# Patient Record
Sex: Male | Born: 1955 | Hispanic: No | Marital: Married | Smoking: Never smoker
Health system: Southern US, Community
[De-identification: ages and names within clinical notes are randomized; demographics above are authoritative.]

## PROBLEM LIST (undated history)

## (undated) DIAGNOSIS — E538 Deficiency of other specified B group vitamins: Secondary | ICD-10-CM

## (undated) DIAGNOSIS — K219 Gastro-esophageal reflux disease without esophagitis: Secondary | ICD-10-CM

## (undated) DIAGNOSIS — E559 Vitamin D deficiency, unspecified: Secondary | ICD-10-CM

## (undated) DIAGNOSIS — I7300 Raynaud's syndrome without gangrene: Secondary | ICD-10-CM

## (undated) DIAGNOSIS — I2510 Atherosclerotic heart disease of native coronary artery without angina pectoris: Secondary | ICD-10-CM

## (undated) DIAGNOSIS — M5090 Cervical disc disorder, unspecified, unspecified cervical region: Secondary | ICD-10-CM

## (undated) DIAGNOSIS — F419 Anxiety disorder, unspecified: Secondary | ICD-10-CM

## (undated) HISTORY — DX: Anxiety disorder, unspecified: F41.9

## (undated) HISTORY — DX: Raynaud's syndrome without gangrene: I73.00

## (undated) HISTORY — DX: Cervical disc disorder, unspecified, unspecified cervical region: M50.90

## (undated) HISTORY — DX: Deficiency of other specified B group vitamins: E53.8

## (undated) HISTORY — DX: Vitamin D deficiency, unspecified: E55.9

## (undated) HISTORY — PX: CARDIAC CATHETERIZATION: SHX172

## (undated) HISTORY — PX: OTHER SURGICAL HISTORY: SHX169

## (undated) HISTORY — DX: Gastro-esophageal reflux disease without esophagitis: K21.9

---

## 2004-09-28 ENCOUNTER — Ambulatory Visit: Payer: Self-pay | Admitting: Internal Medicine

## 2005-09-24 HISTORY — PX: NECK SURGERY: SHX720

## 2007-06-24 DIAGNOSIS — M545 Low back pain, unspecified: Secondary | ICD-10-CM | POA: Insufficient documentation

## 2008-03-03 ENCOUNTER — Ambulatory Visit: Payer: Self-pay | Admitting: Internal Medicine

## 2008-03-04 ENCOUNTER — Telehealth: Payer: Self-pay | Admitting: Internal Medicine

## 2008-03-04 LAB — CONVERTED CEMR LAB
Albumin: 3.8 g/dL (ref 3.5–5.2)
BUN: 12 mg/dL (ref 6–23)
Basophils Relative: 0.1 % (ref 0.0–1.0)
Creatinine, Ser: 1.3 mg/dL (ref 0.4–1.5)
Eosinophils Absolute: 0 10*3/uL (ref 0.0–0.7)
Eosinophils Relative: 0.5 % (ref 0.0–5.0)
GFR calc Af Amer: 74 mL/min
GFR calc non Af Amer: 61 mL/min
HCT: 34.6 % — ABNORMAL LOW (ref 39.0–52.0)
MCV: 85.2 fL (ref 78.0–100.0)
Monocytes Absolute: 0.3 10*3/uL (ref 0.1–1.0)
Platelets: 129 10*3/uL — ABNORMAL LOW (ref 150–400)
RBC: 4.06 M/uL — ABNORMAL LOW (ref 4.22–5.81)
WBC: 3.2 10*3/uL — ABNORMAL LOW (ref 4.5–10.5)

## 2008-03-07 ENCOUNTER — Emergency Department (HOSPITAL_COMMUNITY): Admission: EM | Admit: 2008-03-07 | Discharge: 2008-03-07 | Payer: Self-pay | Admitting: Emergency Medicine

## 2009-03-20 ENCOUNTER — Inpatient Hospital Stay (HOSPITAL_COMMUNITY): Admission: EM | Admit: 2009-03-20 | Discharge: 2009-03-22 | Payer: Self-pay | Admitting: Emergency Medicine

## 2009-04-11 ENCOUNTER — Inpatient Hospital Stay (HOSPITAL_COMMUNITY): Admission: RE | Admit: 2009-04-11 | Discharge: 2009-04-12 | Payer: Self-pay | Admitting: Neurosurgery

## 2009-07-02 ENCOUNTER — Encounter: Payer: Self-pay | Admitting: Family Medicine

## 2009-07-02 ENCOUNTER — Ambulatory Visit: Payer: Self-pay | Admitting: Family Medicine

## 2009-07-02 LAB — CONVERTED CEMR LAB
Blood in Urine, dipstick: NEGATIVE
Glucose, Urine, Semiquant: NEGATIVE
Ketones, urine, test strip: NEGATIVE

## 2009-09-24 HISTORY — PX: COLONOSCOPY: SHX174

## 2010-09-11 IMAGING — CR DG CHEST 2V
2 series · 2 of 2 positions shown · non-contrast
Comparison: 03/02/2009

CLINICAL DATA: Preop for cervical fusion.

CHEST - 2 VIEW

[view not recorded (1 of 2)]
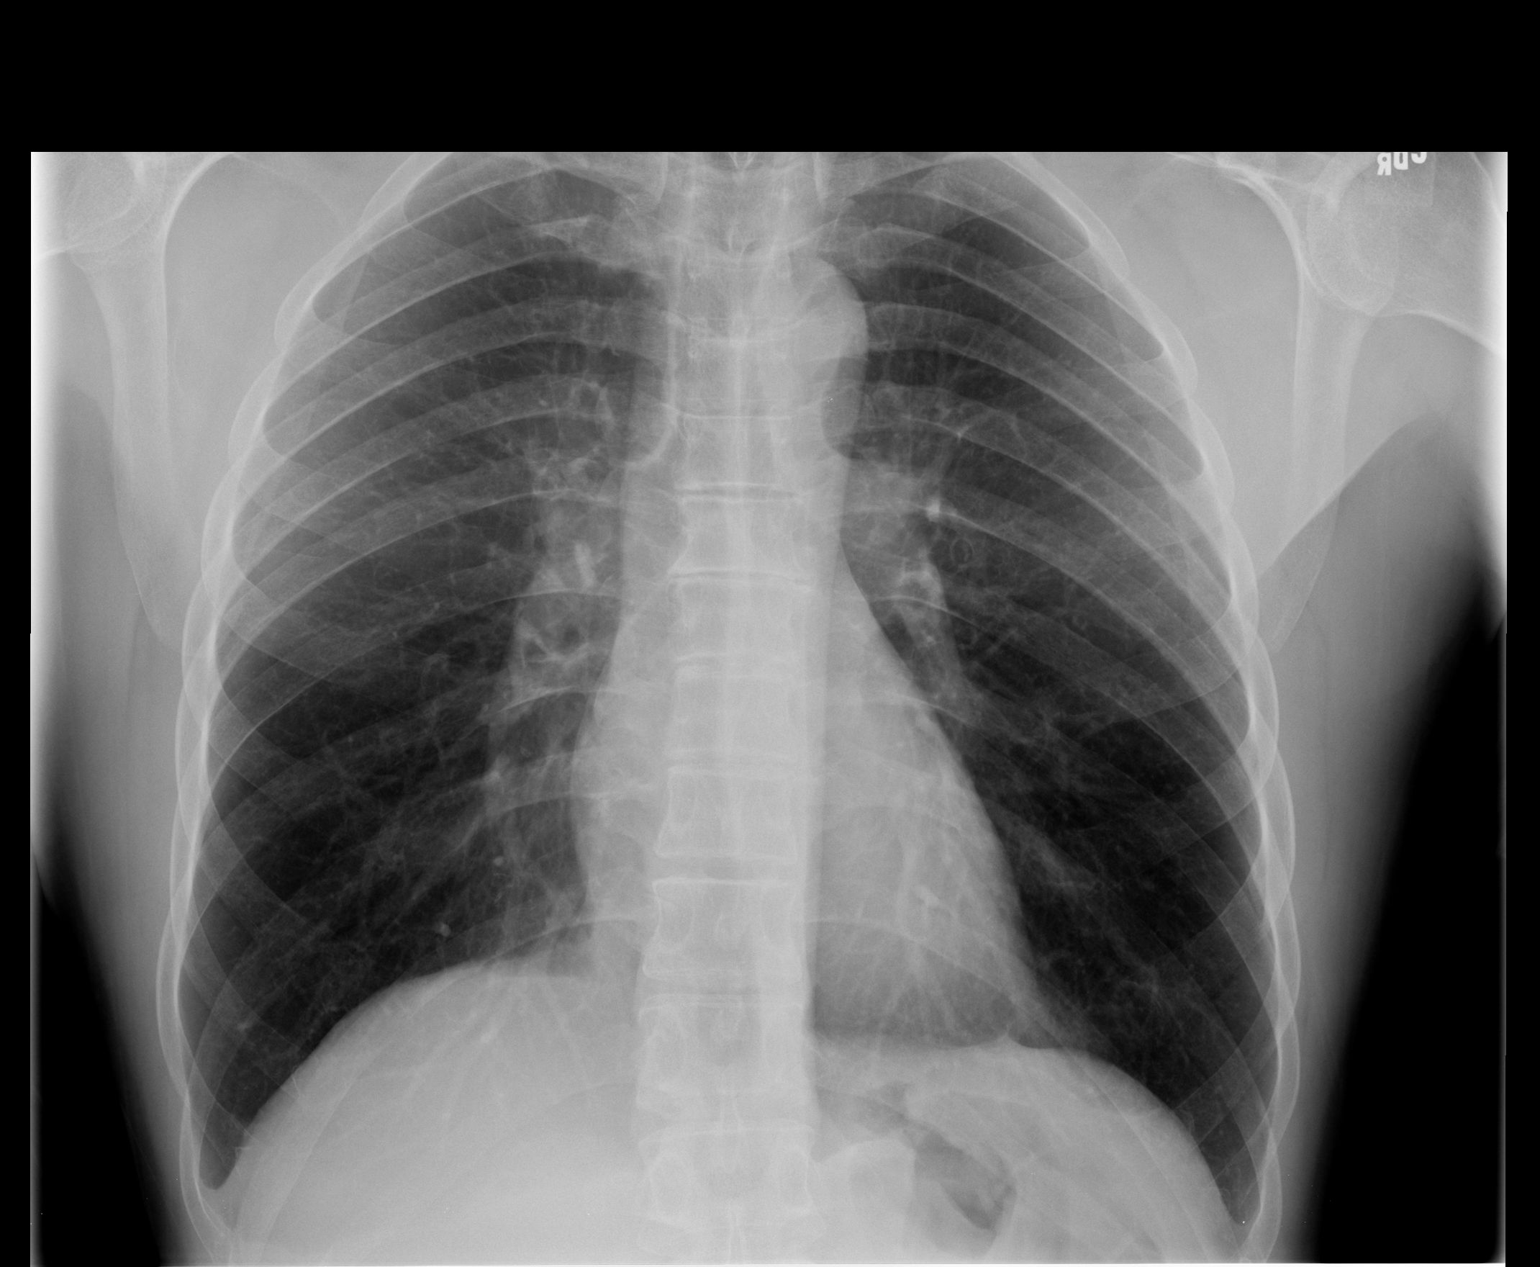

[view not recorded (2 of 2)]
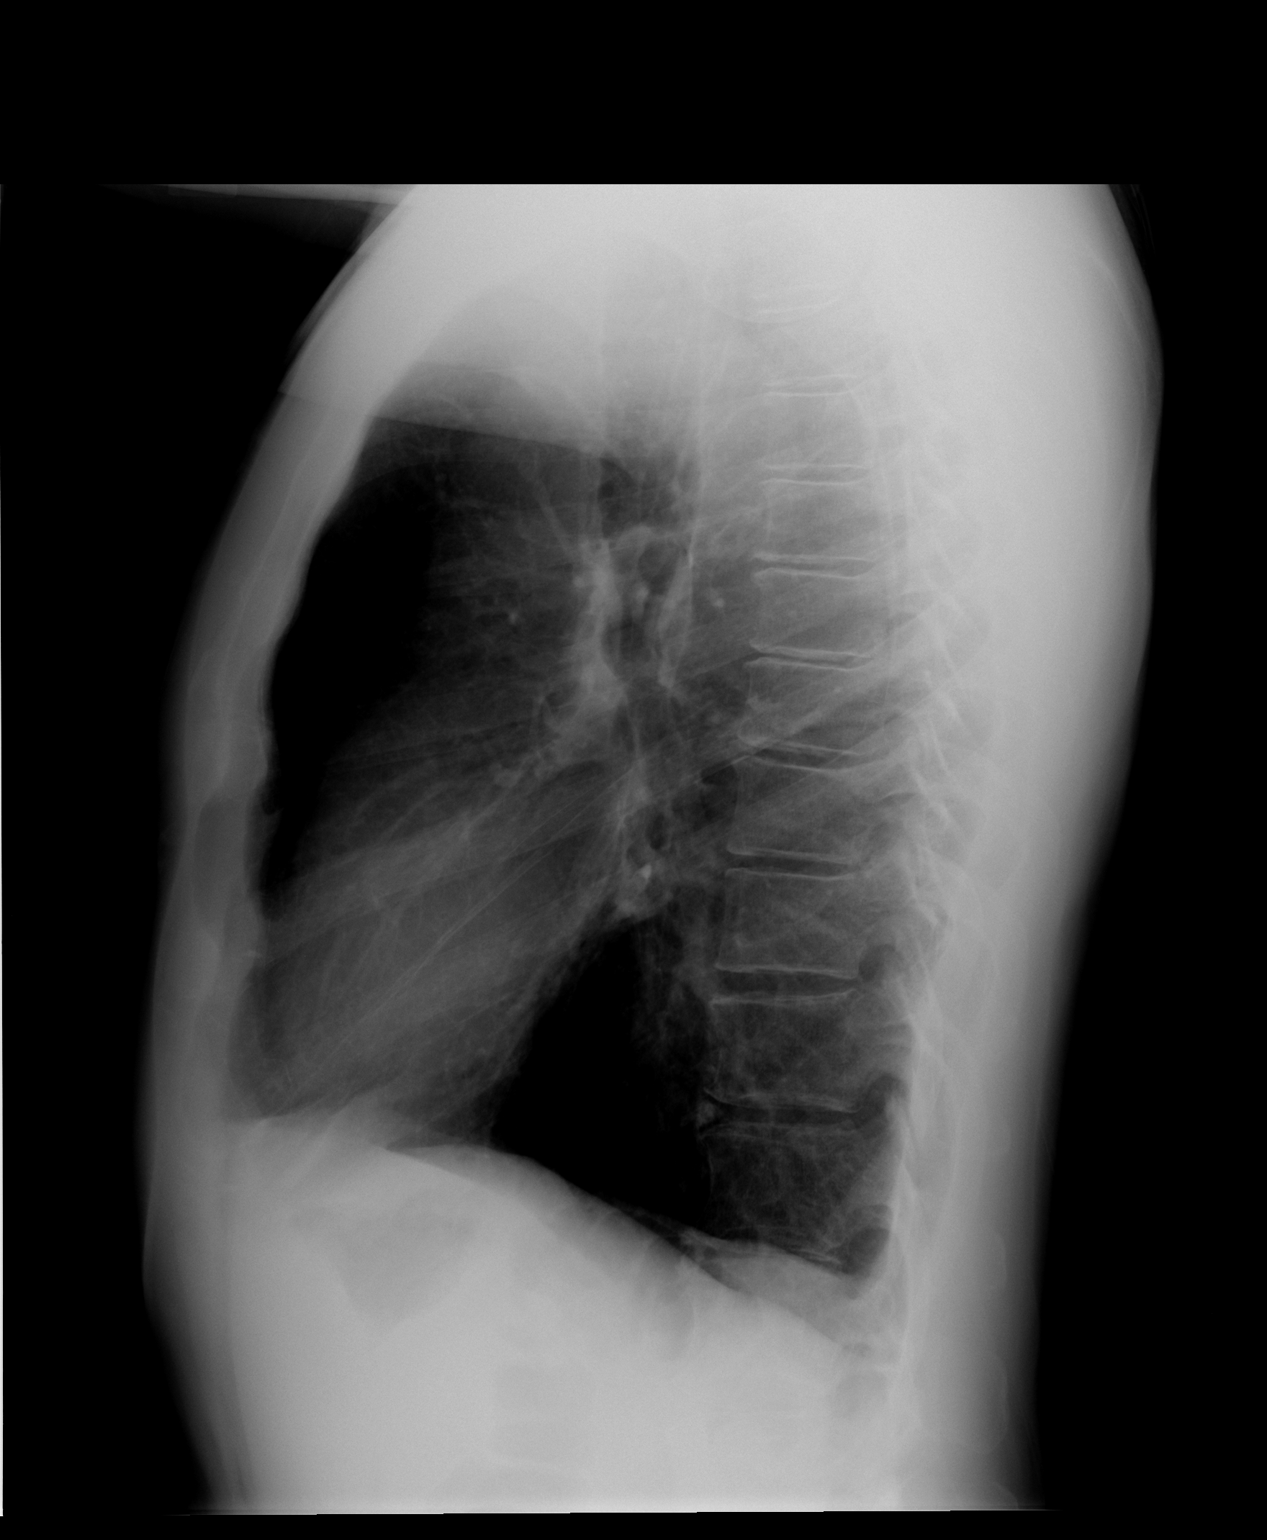

[2 of 2 positions shown; findings below may reference images not displayed]

FINDINGS: Moderate thoracic spondylosis.  Suspect hyperinflation as
can be seen with COPD. Midline trachea. Normal heart size and
mediastinal contours. No pleural effusion or pneumothorax.  Clear
lungs.
IMPRESSION: 1. No acute cardiopulmonary disease.
2.  Hyperinflation as can be seen with COPD.

## 2010-12-31 LAB — CBC
Platelets: 145 10*3/uL — ABNORMAL LOW (ref 150–400)
RDW: 13.1 % (ref 11.5–15.5)
WBC: 4.9 10*3/uL (ref 4.0–10.5)

## 2010-12-31 LAB — PROTIME-INR: INR: 1.1 (ref 0.00–1.49)

## 2010-12-31 LAB — URINALYSIS, ROUTINE W REFLEX MICROSCOPIC
Bilirubin Urine: NEGATIVE
Nitrite: NEGATIVE
Specific Gravity, Urine: 1.024 (ref 1.005–1.030)
Urobilinogen, UA: 0.2 mg/dL (ref 0.0–1.0)
pH: 5 (ref 5.0–8.0)

## 2010-12-31 LAB — COMPREHENSIVE METABOLIC PANEL
AST: 15 U/L (ref 0–37)
Albumin: 4.3 g/dL (ref 3.5–5.2)
Alkaline Phosphatase: 25 U/L — ABNORMAL LOW (ref 39–117)
Chloride: 101 mEq/L (ref 96–112)
GFR calc Af Amer: >60 mL/min (ref >60)
Potassium: 5 mEq/L (ref 3.5–5.1)
Total Bilirubin: 0.7 mg/dL (ref 0.3–1.2)
Total Protein: 6.5 g/dL (ref 6.0–8.3)

## 2010-12-31 LAB — APTT: aPTT: 35 seconds (ref 24–37)

## 2011-02-06 NOTE — Op Note (Signed)
Christopher [name], Christopher [name]                ACCOUNT NO.:  [id number]   MEDICAL RECORD NO.:  [id number]          PATIENT TYPE:  INP   LOCATION:  3537                         FACILITY:  [facility]   PHYSICIAN:  Clydene Fake, M.D.  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   PROCEDURE:  Anterior cervical decompression, diskectomy, and fusion at  C4-5, C5-6, and C6-7 with LifeNet allograft bone, Trestle anterior  cervical plate.   SURGEON:  Clydene Fake, MD   ASSISTANT:  Hilda Lias, MD   ANESTHESIA:  General endotracheal tube anesthesia.   ESTIMATED BLOOD LOSS:  Minimal.   BLOOD GIVEN:  None.   DRAINS:  None.   COMPLICATIONS:  None.   REASON FOR PROCEDURE:  The patient is a 55 year old gentleman who had a  central cord syndrome after __________ water park and was admitted to  the hospital and found to have spinal changes and cervical stenosis at 3  levels with slight cord compression.  The patient slowly had some  improvement and was discharged home with followup in the office in now  about 3-1/2 weeks or so.  Post injury, the patient is plateaued to this  recovery, only having dysesthetic-type pain of middle finger, just slow  movement of his hands, but much, much better than the status before  where he had a hard time moving the hands at all and had severe  dysesthesias through his arms.  Noticed mainly that he tires out when he  is walking a little bit too advanced in the proximal lower extremity due  to weakness but it is very general.  The patient is brought in for  decompression of the  spinal cord.   PROCEDURE IN DETAIL:  The patient was brought to the operating room.  General anesthesia was induced.  The patient was placed in a 10-pound  halter traction, prepped and draped in sterile fashion.  The site of  incision was injected with 10 mL of 1% lidocaine with epinephrine.  An  incision was then made from the midline to the anterior border of the  sternocleidomastoid muscle.  The left side neck incision was taken down  to the platysma and hemostasis was obtained with Bovie cauterization.  The platysma was incised with a Bovie and blunt dissection and taken  through the anterior cervical fascia.  The anterior cervical spine  needle was placed in the interspace.  X-ray was obtained showing this to  be in C5-6 interspace.  Disk space was incised with a 15 blade and a  partial diskectomy was done with pituitary rongeurs.  Longus colli  muscles were reflected laterally from C4 thought C7 bilaterally.  Self-  retaining retractor was placed, so we could see  the C4-5 and C5-6  interspaces.  Distraction pins were placed in the C4 and C6 and  diskectomy then continued at C4-5 and C5-6 by incising the disk space  performing a diskectomy with pituitary rongeurs and curettes and  distracted the interspace.  Microscope was brought in for  microdissection.  We continued the diskectomy with curettes and  pituitary rongeurs and then 1 and 2 mm Kerrison punches were used to  remove the posterior disk, ligament osteophytes and calcified posterior  longitudinal ligaments and this decompressed the central canal.  Bilateral foraminotomies were performed to identify both C4-5 and C5-6  levels.  High-speed drill was used to remove cartilaginous endplate.  We  measured the height of disk space which was 4 mm at C4-5 and 5 mm at C5-  6.  We had good hemostasis with Gelfoam and thrombin.  An appropriate  LifeNet bone grafts were then tapped into place, 4 mm at C4-5 and 5 mm  at C5-6.   Distraction was removed and distraction pin was removed from  C4 and then placed in the C7.  Disk space of C6-7 was incised with a  knife.  Diskectomy was done with pituitary rongeurs and curettes.  The  interspace was distracted and diskectomy continued here with pituitary  rongeurs and curettes and 1- and 2-mm Kerrison punches removing  posterior ligament, disk, and osteophytes and decompressed the central  canal performing bilateral foraminotomies.  Calcified posterior  __________ seen at all these levels.  Once, we were finished, we had  good central decompression.  I used a high-speed drill to remove  cartilaginous endplate and measured the disk space to be 5 mm and a 5-mm  LifeNet allograft bone was tapped into place.  Distraction and  distraction pins were removed.  Weight was removed from traction.  Bone  plugs were appropriately placed.  Irrigated with antibiotic solution.  We had good hemostasis.  The Trestle anterior cervical plate was placed  over the anterior cervical spine and 2 screws were placed at C4, 2 at  the C5, C6, and C7.  These were tightened down.  Lateral x-ray was  obtained showing good position of the plate, screws, and bone plugs at  the C4-5 and C5-6 levels.  We could not see the C6-7 due to the level of  the shoulders, but intraoperatively it was in good position.  We  irrigated with antibiotic solution.  We had good hemostasis.  The  platysma was closed with 3-0 Vicryl interrupted sutures, subcutaneous  tissue was closed with same.  Skin was closed with benzoin and Steri-  Strips.  Dressing was placed.  The patient was placed in a soft cervical  collar, awoke from anesthesia, and transferred to the recovery room in  stable condition.           ______________________________  Clydene Fake, M.D.     JRH/MEDQ  D:  04/11/2009  T:  04/12/2009  Job:  [id number]

## 2011-06-21 LAB — CBC
Hemoglobin: 11.4 — ABNORMAL LOW
MCHC: 35.3
RBC: 3.79 — ABNORMAL LOW
WBC: 4.8

## 2011-06-21 LAB — DIFFERENTIAL
Basophils Absolute: 0
Basophils Relative: 1
Eosinophils Absolute: 0
Eosinophils Relative: 0
Lymphs Abs: 2.7
Neutrophils Relative %: 26 — ABNORMAL LOW

## 2011-06-21 LAB — COMPREHENSIVE METABOLIC PANEL
ALT: 29
AST: 35
Alkaline Phosphatase: 27 — ABNORMAL LOW
CO2: 30
Chloride: 98
GFR calc Af Amer: >60
GFR calc non Af Amer: >60
Glucose, Bld: 119 — ABNORMAL HIGH
Potassium: 3.8
Sodium: 136
Total Bilirubin: 0.8

## 2011-06-21 LAB — LIPASE, BLOOD: Lipase: 35

## 2016-09-11 ENCOUNTER — Other Ambulatory Visit: Payer: Self-pay | Admitting: Internal Medicine

## 2016-09-11 DIAGNOSIS — R0989 Other specified symptoms and signs involving the circulatory and respiratory systems: Secondary | ICD-10-CM

## 2016-09-14 ENCOUNTER — Other Ambulatory Visit: Payer: Self-pay

## 2017-02-07 ENCOUNTER — Ambulatory Visit
Admission: RE | Admit: 2017-02-07 | Discharge: 2017-02-07 | Disposition: A | Discharge status: Home / Self Care | Payer: BC Managed Care – PPO | Source: Ambulatory Visit | Attending: Internal Medicine | Admitting: Internal Medicine

## 2017-02-07 DIAGNOSIS — R0989 Other specified symptoms and signs involving the circulatory and respiratory systems: Secondary | ICD-10-CM

## 2017-03-13 ENCOUNTER — Other Ambulatory Visit: Payer: Self-pay | Admitting: Internal Medicine

## 2017-03-13 ENCOUNTER — Ambulatory Visit
Admission: RE | Admit: 2017-03-13 | Discharge: 2017-03-13 | Disposition: A | Discharge status: Home / Self Care | Payer: BC Managed Care – PPO | Source: Ambulatory Visit | Attending: Internal Medicine | Admitting: Internal Medicine

## 2017-03-13 DIAGNOSIS — M549 Dorsalgia, unspecified: Secondary | ICD-10-CM

## 2018-04-14 ENCOUNTER — Other Ambulatory Visit: Payer: Self-pay | Admitting: Gastroenterology

## 2018-04-14 DIAGNOSIS — R1013 Epigastric pain: Secondary | ICD-10-CM

## 2018-04-22 ENCOUNTER — Ambulatory Visit
Admission: RE | Admit: 2018-04-22 | Discharge: 2018-04-22 | Disposition: A | Discharge status: Home / Self Care | Payer: BC Managed Care – PPO | Source: Ambulatory Visit | Attending: Gastroenterology | Admitting: Gastroenterology

## 2018-04-22 DIAGNOSIS — R1013 Epigastric pain: Secondary | ICD-10-CM

## 2018-04-23 ENCOUNTER — Other Ambulatory Visit: Payer: BC Managed Care – PPO

## 2018-09-24 HISTORY — PX: UPPER GASTROINTESTINAL ENDOSCOPY: SHX188

## 2019-11-19 ENCOUNTER — Ambulatory Visit: Payer: BC Managed Care – PPO | Attending: Internal Medicine

## 2019-11-19 DIAGNOSIS — Z23 Encounter for immunization: Secondary | ICD-10-CM | POA: Insufficient documentation

## 2019-11-19 NOTE — Progress Notes (Signed)
   Covid-19 Vaccination Clinic  Name:  Christopher [name]    MRN: [id number] DOB: 08-17-1956  11/19/2019  Mr. Christopher [name] was observed post Covid-19 immunization for 15 minutes without incidence. He was provided with Vaccine Information Sheet and instruction to access the V-Safe system.   Mr. Christopher [name] was instructed to call 911 with any severe reactions post vaccine: Marland Kitchen Difficulty breathing  . Swelling of your face and throat  . A fast heartbeat  . A bad rash all over your body  . Dizziness and weakness    Immunizations Administered    Name Date Dose VIS Date Route   Pfizer COVID-19 Vaccine 11/19/2019  9:08 AM 0.3 mL 09/04/2019 Intramuscular   Manufacturer: [place]   Lot: [id number]   [place]: [id number]

## 2019-12-15 ENCOUNTER — Ambulatory Visit: Payer: BC Managed Care – PPO | Attending: Internal Medicine

## 2019-12-15 DIAGNOSIS — Z23 Encounter for immunization: Secondary | ICD-10-CM

## 2019-12-15 NOTE — Progress Notes (Signed)
   Covid-19 Vaccination Clinic  Name:  Christopher [name]    MRN: [id number] DOB: 01-Jul-1956  12/15/2019  Christopher [name] was observed post Covid-19 immunization for 15 minutes without incident. He was provided with Vaccine Information Sheet and instruction to access the V-Safe system.   Christopher [name] was instructed to call 911 with any severe reactions post vaccine: Marland Kitchen Difficulty breathing  . Swelling of face and throat  . A fast heartbeat  . A bad rash all over body  . Dizziness and weakness   Immunizations Administered    Name Date Dose VIS Date Route   Pfizer COVID-19 Vaccine 12/15/2019  9:29 AM 0.3 mL 09/04/2019 Intramuscular   Manufacturer: [place]   Lot: [id number]   [place]: [id number]

## 2020-05-18 ENCOUNTER — Encounter: Payer: Self-pay | Admitting: Internal Medicine

## 2020-07-21 ENCOUNTER — Encounter: Payer: BC Managed Care – PPO | Admitting: Internal Medicine

## 2020-08-25 ENCOUNTER — Other Ambulatory Visit: Payer: Self-pay

## 2020-08-25 ENCOUNTER — Ambulatory Visit (AMBULATORY_SURGERY_CENTER): Payer: Self-pay | Admitting: *Deleted

## 2020-08-25 VITALS — Ht 68.0 in | Wt 145.0 lb

## 2020-08-25 DIAGNOSIS — Z1211 Encounter for screening for malignant neoplasm of colon: Secondary | ICD-10-CM

## 2020-08-25 MED ORDER — SUTAB 1479-225-188 MG PO TABS: 1.0000 | ORAL_TABLET | ORAL | 0 refills | Status: DC

## 2020-08-25 NOTE — Progress Notes (Signed)
Patient is here in-person for PV. Patient denies any allergies to eggs or soy. Patient denies any problems with anesthesia/sedation. Patient denies any oxygen use at home. Patient denies taking any diet/weight loss medications or blood thinners. Patient is not being treated for MRSA or C-diff. Patient is aware of our care-partner policy and JXKAJ-[id number] safety protocol. EMMI education assigned to the patient for the procedure, pt aware.   COVID-19 vaccines completed on 12/15/19 x2, per patient.   Prep Prescription coupon given to the patient. Patient states he had heart burn about 2 weeks ago after eating spicy foods and his protonix must not be helping so he stopped taking this. He will discuss this with Dr.Perry at the time of the colonoscopy, he declined office visit at this time.

## 2020-08-26 ENCOUNTER — Encounter: Payer: Self-pay | Admitting: Internal Medicine

## 2020-09-08 ENCOUNTER — Encounter: Payer: Self-pay | Admitting: Internal Medicine

## 2020-09-08 ENCOUNTER — Other Ambulatory Visit: Payer: Self-pay

## 2020-09-08 ENCOUNTER — Ambulatory Visit (AMBULATORY_SURGERY_CENTER): Payer: BC Managed Care – PPO | Admitting: Internal Medicine

## 2020-09-08 VITALS — BP 105/72 | HR 70 | Temp 97.5°F | Resp 14 | Ht 68.0 in | Wt 145.0 lb

## 2020-09-08 DIAGNOSIS — Z1211 Encounter for screening for malignant neoplasm of colon: Secondary | ICD-10-CM | POA: Diagnosis present

## 2020-09-08 DIAGNOSIS — K635 Polyp of colon: Secondary | ICD-10-CM | POA: Diagnosis not present

## 2020-09-08 DIAGNOSIS — D120 Benign neoplasm of cecum: Secondary | ICD-10-CM

## 2020-09-08 MED ORDER — SODIUM CHLORIDE 0.9 % IV SOLN: 500.0000 mL | Freq: Once | INTRAVENOUS | Status: DC

## 2020-09-08 NOTE — Progress Notes (Signed)
Vital signs checked by:SP  The patient states no changes in medical or surgical history since pre-visit screening on 08/25/20.

## 2020-09-08 NOTE — Op Note (Signed)
[place] Patient Name: Christopher [name] Procedure Date: 09/08/2020 8:59 AM MRN: [id number] Endoscopist: Docia Chuck. Henrene Pastor , MD Age: 64 Referring MD:  Date of Birth: April 06, 1956 Gender: Male Account #: [id number] Procedure:                Colonoscopy with cold snare polypectomy x 1 Indications:              Screening for colorectal malignant neoplasm.                            Previous index exam elsewhere Medicines:                Monitored Anesthesia Care Procedure:                Pre-Anesthesia Assessment:                           - Prior to the procedure, a History and Physical                            was performed, and patient medications and                            allergies were reviewed. The patient's tolerance of                            previous anesthesia was also reviewed. The risks                            and benefits of the procedure and the sedation                            options and risks were discussed with the patient.                            All questions were answered, and informed consent                            was obtained. Prior Anticoagulants: The patient has                            taken no previous anticoagulant or antiplatelet                            agents. ASA Grade Assessment: II - A patient with                            mild systemic disease. After reviewing the risks                            and benefits, the patient was deemed in                            satisfactory condition to undergo the procedure.  After obtaining informed consent, the colonoscope                            was passed under direct vision. Throughout the                            procedure, the patient's blood pressure, pulse, and                            oxygen saturations were monitored continuously. The                            Olympus CF-HQ190L (Serial# [id number]) Colonoscope was                             introduced through the anus and advanced to the the                            cecum, identified by appendiceal orifice and                            ileocecal valve. The ileocecal valve, appendiceal                            orifice, and rectum were photographed. The quality                            of the bowel preparation was excellent. The                            colonoscopy was performed without difficulty. The                            patient tolerated the procedure well. The bowel                            preparation used was SUPREP via split dose                            instruction. Scope In: 9:17:32 AM Scope Out: 9:32:09 AM Scope Withdrawal Time: 0 hours 10 minutes 31 seconds  Total Procedure Duration: 0 hours 14 minutes 37 seconds  Findings:                 A 4 mm polyp was found in the cecum. The polyp was                            sessile. The polyp was removed with a cold snare.                            Resection and retrieval were complete.                           The entire examined colon appeared normal on direct  and retroflexion views. Complications:            No immediate complications. Estimated blood loss:                            None. Estimated Blood Loss:     Estimated blood loss: none. Impression:               - One 4 mm polyp in the cecum, removed with a cold                            snare. Resected and retrieved.                           - The entire examined colon is normal on direct and                            retroflexion views. Recommendation:           - Repeat colonoscopy in 5-10 years for surveillance.                           - Patient has a contact number available for                            emergencies. The signs and symptoms of potential                            delayed complications were discussed with the                            patient. Return to normal activities tomorrow.                             Written discharge instructions were provided to the                            patient.                           - Resume previous diet.                           - Continue present medications.                           - Await pathology results. Docia Chuck. Henrene Pastor, MD 09/08/2020 9:38:15 AM This report has been signed electronically.

## 2020-09-08 NOTE — Progress Notes (Signed)
Called to room to assist during endoscopic procedure.  Patient ID and intended procedure confirmed with present staff. Received instructions for my participation in the procedure from the performing physician.  

## 2020-09-08 NOTE — Progress Notes (Signed)
pt tolerated well. VSS. awake and to recovery. Report given to RN.  

## 2020-09-08 NOTE — Patient Instructions (Signed)
Thank you for letting us take care of your healthcare needs today. ?Please see handouts given to you on Polyps. ? ? ? ?YOU HAD AN ENDOSCOPIC PROCEDURE TODAY AT THE [facility]:   Refer to the procedure report that was given to you for any specific questions about what was found during the examination.  If the procedure report does not answer your questions, please call your gastroenterologist to clarify.  If you requested that your care partner not be given the details of your procedure findings, then the procedure report has been included in a sealed envelope for you to review at your convenience later. ? ?YOU SHOULD EXPECT: Some feelings of bloating in the abdomen. Passage of more gas than usual.  Walking can help get rid of the air that was put into your GI tract during the procedure and reduce the bloating. If you had a lower endoscopy (such as a colonoscopy or flexible sigmoidoscopy) you may notice spotting of blood in your stool or on the toilet paper. If you underwent a bowel prep for your procedure, you may not have a normal bowel movement for a few days. ? ?Please Note:  You might notice some irritation and congestion in your nose or some drainage.  This is from the oxygen used during your procedure.  There is no need for concern and it should clear up in a day or so. ? ?SYMPTOMS TO REPORT IMMEDIATELY: ? ?Following lower endoscopy (colonoscopy or flexible sigmoidoscopy): ? Excessive amounts of blood in the stool ? Significant tenderness or worsening of abdominal pains ? Swelling of the abdomen that is new, acute ? Fever of 100?F or higher ?For urgent or emergent issues, a gastroenterologist can be reached at any hour by calling [phone]. ?Do not use MyChart messaging for urgent concerns.  ? ? ?DIET:  We do recommend a small meal at first, but then you may proceed to your regular diet.  Drink plenty of fluids but you should avoid alcoholic beverages for 24 hours. ? ?ACTIVITY:  You should  plan to take it easy for the rest of today and you should NOT DRIVE or use heavy machinery until tomorrow (because of the sedation medicines used during the test).   ? ?FOLLOW UP: ?Our staff will call the number listed on your records 48-72 hours following your procedure to check on you and address any questions or concerns that you may have regarding the information given to you following your procedure. If we do not reach you, we will leave a message.  We will attempt to reach you two times.  During this call, we will ask if you have developed any symptoms of COVID 19. If you develop any symptoms (ie: fever, flu-like symptoms, shortness of breath, cough etc.) before then, please call [phone].  If you test positive for Covid 19 in the 2 weeks post procedure, please call and report this information to us.   ? ?If any biopsies were taken you will be contacted by phone or by letter within the next 1-3 weeks.  Please call us at [phone] if you have not heard about the biopsies in 3 weeks.  ? ? ?SIGNATURES/CONFIDENTIALITY: ?You and/or your care partner have signed paperwork which will be entered into your electronic medical record.  These signatures attest to the fact that that the information above on your After Visit Summary has been reviewed and is understood.  Full responsibility of the confidentiality of this discharge information lies with you and/or   you and/or your care-partner.

## 2020-09-12 ENCOUNTER — Telehealth: Payer: Self-pay

## 2020-09-12 NOTE — Telephone Encounter (Signed)
Left message on follow up call. 

## 2020-09-12 NOTE — Telephone Encounter (Signed)
  Follow up Call-  Call back number 09/08/2020  Post procedure Call Back phone  # [phone]  Permission to leave phone message Yes  Some recent data might be hidden     Patient questions:  Do you have a fever, pain , or abdominal swelling? No. Pain Score  0 *  Have you tolerated food without any problems? Yes.    Have you been able to return to your normal activities? Yes.    Do you have any questions about your discharge instructions: Diet   No. Medications  No. Follow up visit  No.  Do you have questions or concerns about your Care? No.  Actions: * If pain score is 4 or above: No action needed, pain <4. 1. Have you developed a fever since your procedure? no  2.   Have you had an respiratory symptoms (SOB or cough) since your procedure? no  3.   Have you tested positive for COVID 19 since your procedure no  4.   Have you had any family members/close contacts diagnosed with the COVID 19 since your procedure?  No Patient feels like he is starting to get a cold since his wife has a cold.  He is having a scratchy throat, headache and runny nose.  No fever.  If he is not better, he said that he would go get a COVID test and will let us know the results.   If yes to any of these questions please route to Joylene John, RN and Joella Prince, RN

## 2020-09-14 ENCOUNTER — Encounter: Payer: Self-pay | Admitting: Internal Medicine

## 2020-10-06 ENCOUNTER — Ambulatory Visit: Payer: BC Managed Care – PPO | Admitting: Internal Medicine

## 2020-10-24 ENCOUNTER — Telehealth: Payer: Self-pay

## 2020-10-24 NOTE — Telephone Encounter (Signed)
Lmom with the patients new appt date,time, and the office location. Patient is to contact the office if any questions.

## 2020-10-24 NOTE — Telephone Encounter (Signed)
-----   Message from Wellington Hampshire, MD sent at 10/23/2020 11:48 AM EST ----- This is a family friend. He is scheduled to see me on 2/8. He is having a lot of chest pain and shortness of breath. Please move his appointment to 2/1 at 4:40 pm. Thanks.

## 2020-10-25 ENCOUNTER — Encounter: Payer: Self-pay | Admitting: Cardiovascular Disease

## 2020-10-25 ENCOUNTER — Other Ambulatory Visit: Payer: Self-pay

## 2020-10-25 ENCOUNTER — Ambulatory Visit: Payer: BC Managed Care – PPO | Admitting: Cardiovascular Disease

## 2020-10-25 DIAGNOSIS — R072 Precordial pain: Secondary | ICD-10-CM

## 2020-10-25 MED ORDER — METOPROLOL TARTRATE 50 MG PO TABS: ORAL_TABLET | ORAL | 0 refills | Status: DC

## 2020-10-25 NOTE — Progress Notes (Signed)
Cardiology Office Note   Date:  10/25/2020   ID:  COLLEN VINCENT, DOB 06-Oct-1955, MRN [id number]  PCP:  Wenda Low, MD  Cardiologist:   Kathlyn Sacramento, MD   Chief Complaint  Patient presents with  . New Patient (Initial Visit)    Ref by Dr. Lysle Rubens for exertional chest pain, headache and epigastric pain. Medications reviewed by the patient verbally.       History of Present Illness: PHEONIX CLINKSCALE is a 65 y.o. male who was referred by Dr. Deforest Hoyles for evaluation of exertional chest pain.  The patient has no prior cardiac history.  Overall he has been relatively healthy with no significant chronic medical conditions other than severe GERD and Raynaud's disease.  He is not a smoker.  He does have family history of premature coronary artery disease. He works as a Network engineer at [employer]. He had previous lower extremity arterial Doppler in 2018 that was normal. He had symptoms of Covid in the second week of December and was sick for about 10 days.  However, rapid testing was negative for Covid.  After that, he started having exertional epigastric discomfort radiating to the substernal area, the neck and the left side of the chest.  The pain is burning sensation in quality and mainly happens with exertion.  No associated shortness of breath.  His symptoms have been even just taking the trash out.  Before he got sick, he used to bike on a regular basis with no symptoms. He saw his primary care physician for the symptoms.  D-dimer was negative.  Past Medical History:  Diagnosis Date  . Anxiety   . GERD (gastroesophageal reflux disease)    triggers by eating spicy food per pt    Past Surgical History:  Procedure Laterality Date  . COLONOSCOPY  2011   Johnson-normal exam per pt  . infertility surgery    . NECK SURGERY  2007  . UPPER GASTROINTESTINAL ENDOSCOPY  2020   Ganem-h.pylori per pt     Current Outpatient Medications  Medication Sig Dispense Refill  . metoprolol  tartrate (LOPRESSOR) 50 MG tablet Take 1 tablet (50 mg) 2 hours prior to Cardiac CTA 1 tablet 0  . pantoprazole (PROTONIX) 40 MG tablet Take 40 mg by mouth daily.     No current facility-administered medications for this visit.    Allergies:   Azithromycin    Social History:  The patient  reports that he has never smoked. He has never used smokeless tobacco. He reports previous alcohol use. He reports previous drug use.   Family History:  The patient's family history includes Heart disease (age of onset: 38) in his mother; Hyperlipidemia in his mother; Hypertension in his mother; Stroke in his father.    ROS:  Please see the history of present illness.   Otherwise, review of systems are positive for none.   All other systems are reviewed and negative.    PHYSICAL EXAM: VS:  BP 116/66 (BP Location: Right Arm, Patient Position: Sitting, Cuff Size: Normal)   Pulse 65   Ht 5\' 8"  (1.727 m)   Wt 144 lb (65.3 kg)   SpO2 98%   BMI 21.90 kg/m  , BMI Body mass index is 21.9 kg/m. GEN: Well nourished, well developed, in no acute distress  HEENT: normal  Neck: no JVD, carotid bruits, or masses Cardiac: RRR; no murmurs, rubs, or gallops,no edema  Respiratory:  clear to auscultation bilaterally, normal work of breathing GI: soft, nontender,  nondistended, + BS MS: no deformity or atrophy  Skin: warm and dry, no rash Neuro:  Strength and sensation are intact Psych: euthymic mood, full affect Toes are slightly bluish but said to be done dorsalis pedis pulses normal bilaterally.  EKG:  EKG is ordered today. The ekg ordered today demonstrates normal sinus rhythm with no significant ST or T wave changes.   Recent Labs: No results found for requested labs within last 8760 hours.    Lipid Panel No results found for: CHOL, TRIG, HDL, CHOLHDL, VLDL, LDLCALC, LDLDIRECT    Wt Readings from Last 3 Encounters:  10/25/20 144 lb (65.3 kg)  09/08/20 145 lb (65.8 kg)  08/25/20 145 lb (65.8 kg)         PAD Screen 10/25/2020  Previous PAD dx? No  Previous surgical procedure? No  Pain with walking? No  Feet/toe relief with dangling? No  Painful, non-healing ulcers? No  Extremities discolored? Yes      ASSESSMENT AND PLAN:  1.  Exertional abdominal and chest pain: He describes a lot of burning sensation which could be due to GERD.  However, his symptoms are mainly exertional which is worrisome and raises possibility of obstructive coronary artery disease.  His symptoms are suggestive of class III angina.  He was very active before his current symptoms and was able to exercise regularly without limitations but he seems to be very limited now even doing regular activities.  His cardiac exam is unremarkable and baseline EKG is normal with no ischemic changes.  Nonetheless, his symptoms require urgent cardiac evaluation.  I discussed different options with him and I think the best option is to proceed with cardiac CTA with FFR if needed.  2.  GERD: The patient is scheduled to see gastroenterology later this month.    Disposition:   FU with me based on results of cardiac CTA.  Signed,  Kathlyn Sacramento, MD  10/25/2020 5:19 PM    [facility]

## 2020-10-25 NOTE — H&P (View-Only) (Signed)
  Cardiology Office Note   Date:  10/25/2020   ID:  Christopher [name], DOB 06/23/1956, MRN [id number]  PCP:  Husain, Karrar, MD  Cardiologist:   Graciela Plato, MD   Chief Complaint  Patient presents with  . New Patient (Initial Visit)    Ref by Dr. Husain for exertional chest pain, headache and epigastric pain. Medications reviewed by the patient verbally.       History of Present Illness: Christopher [name] is a 64 y.o. male who was referred by Dr. Hussain for evaluation of exertional chest pain.  The patient has no prior cardiac history.  Overall he has been relatively healthy with no significant chronic medical conditions other than severe GERD and Raynaud's disease.  He is not a smoker.  He does have family history of premature coronary artery disease. He works as a professor at [employer]. He had previous lower extremity arterial Doppler in 2018 that was normal. He had symptoms of Covid in the second week of December and was sick for about 10 days.  However, rapid testing was negative for Covid.  After that, he started having exertional epigastric discomfort radiating to the substernal area, the neck and the left side of the chest.  The pain is burning sensation in quality and mainly happens with exertion.  No associated shortness of breath.  His symptoms have been even just taking the trash out.  Before he got sick, he used to bike on a regular basis with no symptoms. He saw his primary care physician for the symptoms.  D-dimer was negative.  Past Medical History:  Diagnosis Date  . Anxiety   . GERD (gastroesophageal reflux disease)    triggers by eating spicy food per pt    Past Surgical History:  Procedure Laterality Date  . COLONOSCOPY  2011   Johnson-normal exam per pt  . infertility surgery    . NECK SURGERY  2007  . UPPER GASTROINTESTINAL ENDOSCOPY  2020   Ganem-h.pylori per pt     Current Outpatient Medications  Medication Sig Dispense Refill  . metoprolol  tartrate (LOPRESSOR) 50 MG tablet Take 1 tablet (50 mg) 2 hours prior to Cardiac CTA 1 tablet 0  . pantoprazole (PROTONIX) 40 MG tablet Take 40 mg by mouth daily.     No current facility-administered medications for this visit.    Allergies:   Azithromycin    Social History:  The patient  reports that he has never smoked. He has never used smokeless tobacco. He reports previous alcohol use. He reports previous drug use.   Family History:  The patient's family history includes Heart disease (age of onset: 74) in his mother; Hyperlipidemia in his mother; Hypertension in his mother; Stroke in his father.    ROS:  Please see the history of present illness.   Otherwise, review of systems are positive for none.   All other systems are reviewed and negative.    PHYSICAL EXAM: VS:  BP 116/66 (BP Location: Right Arm, Patient Position: Sitting, Cuff Size: Normal)   Pulse 65   Ht 5' 8" (1.727 m)   Wt 144 lb (65.3 kg)   SpO2 98%   BMI 21.90 kg/m  , BMI Body mass index is 21.9 kg/m. GEN: Well nourished, well developed, in no acute distress  HEENT: normal  Neck: no JVD, carotid bruits, or masses Cardiac: RRR; no murmurs, rubs, or gallops,no edema  Respiratory:  clear to auscultation bilaterally, normal work of breathing GI: soft, nontender,   nondistended, + BS MS: no deformity or atrophy  Skin: warm and dry, no rash Neuro:  Strength and sensation are intact Psych: euthymic mood, full affect Toes are slightly bluish but said to be done dorsalis pedis pulses normal bilaterally.  EKG:  EKG is ordered today. The ekg ordered today demonstrates normal sinus rhythm with no significant ST or T wave changes.   Recent Labs: No results found for requested labs within last 8760 hours.    Lipid Panel No results found for: CHOL, TRIG, HDL, CHOLHDL, VLDL, LDLCALC, LDLDIRECT    Wt Readings from Last 3 Encounters:  10/25/20 144 lb (65.3 kg)  09/08/20 145 lb (65.8 kg)  08/25/20 145 lb (65.8 kg)         PAD Screen 10/25/2020  Previous PAD dx? No  Previous surgical procedure? No  Pain with walking? No  Feet/toe relief with dangling? No  Painful, non-healing ulcers? No  Extremities discolored? Yes      ASSESSMENT AND PLAN:  1.  Exertional abdominal and chest pain: He describes a lot of burning sensation which could be due to GERD.  However, his symptoms are mainly exertional which is worrisome and raises possibility of obstructive coronary artery disease.  His symptoms are suggestive of class III angina.  He was very active before his current symptoms and was able to exercise regularly without limitations but he seems to be very limited now even doing regular activities.  His cardiac exam is unremarkable and baseline EKG is normal with no ischemic changes.  Nonetheless, his symptoms require urgent cardiac evaluation.  I discussed different options with him and I think the best option is to proceed with cardiac CTA with FFR if needed.  2.  GERD: The patient is scheduled to see gastroenterology later this month.    Disposition:   FU with me based on results of cardiac CTA.  Signed,  Kathlyn Sacramento, MD  10/25/2020 5:19 PM    [facility]

## 2020-10-25 NOTE — Patient Instructions (Signed)
Medication Instructions:  Your physician recommends that you continue on your current medications as directed. Please refer to the Current Medication list given to you today.  A one time dose of Metoprolol 50 mg tablet to be taken 2 hours prior to your Cardiac CTA has been sent to your pharmacy.  *If you need a refill on your cardiac medications before your next appointment, please call your pharmacy*   Lab Work: You will need to have labwork (bmet) a couple of days prior to your Cardiac CT. Please take the lab order given to you today to your pcp.  If you have labs (blood work) drawn today and your tests are completely normal, you will receive your results only by: Marland Kitchen MyChart Message (if you have MyChart) OR . A paper copy in the mail If you have any lab test that is abnormal or we need to change your treatment, we will call you to review the results.   Testing/Procedures: Your physician has requested that you have cardiac CT. Cardiac computed tomography (CT) is a painless test that uses an x-ray machine to take clear, detailed pictures of your heart. For further information please visit [email or web address]. Please follow instruction sheet as given.      Follow-Up: At [facility], you and your health needs are our priority.  As part of our continuing mission to provide you with exceptional heart care, we have created designated Provider Care Teams.  These Care Teams include your primary Cardiologist (physician) and Advanced Practice Providers (APPs -  Physician Assistants and Nurse Practitioners) who all work together to provide you with the care you need, when you need it.  We recommend signing up for the patient portal called "MyChart".  Sign up information is provided on this After Visit Summary.  MyChart is used to connect with patients for Virtual Visits (Telemedicine).  Patients are able to view lab/test results, encounter notes, upcoming appointments, etc.  Non-urgent messages  can be sent to your provider as well.   To learn more about what you can do with MyChart, go to [email or web address].    Your next appointment:   Pending test results   The format for your next appointment:   In Person  Provider:   You may see  Kathlyn Sacramento, MD or one of the following Advanced Practice Providers on your designated Care Team:    Murray Hodgkins, NP  Christell Faith, PA-C  Marrianne Mood, PA-C  Cadence Kathlen Mody, [place]  Laurann Montana, NP    Other Instructions  Your cardiac CT will be scheduled at one of the below locations:   [facility] [address] [place], [place] [address] [phone]   If scheduled at [facility], please arrive at the [facility] main entrance of [facility] 30 minutes prior to test start time. Proceed to the [facility] Radiology Department (first floor) to check-in and test prep.  If scheduled at [facility], please arrive 15 mins early for check-in and test prep.  Please follow these instructions carefully (unless otherwise directed):  Hold all erectile dysfunction medications at least 3 days (72 hrs) prior to test.  On the Night Before the Test: . Be sure to Drink plenty of water. . Do not consume any caffeinated/decaffeinated beverages or chocolate 12 hours prior to your test. . Do not take any antihistamines 12 hours prior to your test.  On the Day of the Test: . Drink plenty of water. Do not drink any water  within one hour of the test. . Do not eat any food 4 hours prior to the test. . You may take your regular medications prior to the test.  . Take metoprolol (Lopressor) two hours prior to test.       After the Test: . Drink plenty of water. . After receiving IV contrast, you may experience a mild flushed feeling. This is normal. . On occasion, you may experience a mild rash up to 24 hours after the test. This is not dangerous. If this occurs, you can take  Benadryl 25 mg and increase your fluid intake. . If you experience trouble breathing, this can be serious. If it is severe call 911 IMMEDIATELY. If it is mild, please call our office. . If you take any of these medications: Glipizide/Metformin, Avandament, Glucavance, please do not take 48 hours after completing test unless otherwise instructed.   Once we have confirmed authorization from your insurance company, we will call you to set up a date and time for your test. Based on how quickly your insurance processes prior authorizations requests, please allow up to 4 weeks to be contacted for scheduling your Cardiac CT appointment. Be advised that routine Cardiac CT appointments could be scheduled as many as 8 weeks after your provider has ordered it.  For non-scheduling related questions, please contact the cardiac imaging nurse navigator should you have any questions/concerns: Marchia Bond, Cardiac Imaging Nurse Navigator Burley Saver, Interim Cardiac Imaging Nurse [facility] Direct Office Dial: [phone]   For scheduling needs, including cancellations and rescheduling, please call [place], [phone].

## 2020-10-26 ENCOUNTER — Other Ambulatory Visit: Payer: Self-pay | Admitting: Cardiovascular Disease

## 2020-11-01 ENCOUNTER — Ambulatory Visit: Payer: Self-pay | Admitting: Cardiovascular Disease

## 2020-11-07 ENCOUNTER — Telehealth: Payer: Self-pay | Admitting: Cardiovascular Disease

## 2020-11-07 NOTE — Telephone Encounter (Signed)
No answer. Left message to call back.   

## 2020-11-07 NOTE — Telephone Encounter (Signed)
I spoke with the patient. He states he is scheduled for his Cardiac CT on 11/15/20. He was given lab order for his BMP at his appointment with Dr. Fletcher Anon, but cannot find this to take to his PCP office.  The patient is requesting the order be faxed to him at [phone]. I have faxed the BMP order to him at the # requested. Confirmation received.

## 2020-11-07 NOTE — Telephone Encounter (Signed)
Patient unable to find avs with this information and is requesting it be faxed to him at [phone].  Sent per request.  Patient will call back if he has further questions.

## 2020-11-07 NOTE — Telephone Encounter (Signed)
Patient ct scheduled and he wants to review pre test instructions for labs he plans on getting at pcp office.  Please call.

## 2020-11-07 NOTE — Telephone Encounter (Signed)
Patient returning call.

## 2020-11-14 ENCOUNTER — Telehealth (HOSPITAL_COMMUNITY): Payer: Self-pay | Admitting: *Deleted

## 2020-11-14 NOTE — Telephone Encounter (Signed)
Pt returning call regarding upcoming cardiac imaging study; pt verbalizes understanding of appt date/time, parking situation and where to check in, pre-test NPO status and medications ordered, and verified current allergies; name and call back number provided for further questions should they arise  Lizvette Lightsey RN Navigator Cardiac Imaging [facility] [phone] office [phone] cell  

## 2020-11-14 NOTE — Telephone Encounter (Signed)
Attempted to call patient regarding upcoming cardiac CT appointment. °Left message on voicemail with name and callback number ° °Jamesen Stahnke RN Navigator Cardiac Imaging °[facility] °[phone] Office °[phone] Cell ° °

## 2020-11-15 ENCOUNTER — Other Ambulatory Visit: Payer: Self-pay

## 2020-11-15 ENCOUNTER — Ambulatory Visit (HOSPITAL_COMMUNITY)
Admission: RE | Admit: 2020-11-15 | Discharge: 2020-11-15 | Disposition: A | Discharge status: Home / Self Care | Payer: BC Managed Care – PPO | Source: Ambulatory Visit | Attending: Cardiovascular Disease | Admitting: Cardiovascular Disease

## 2020-11-15 ENCOUNTER — Telehealth: Payer: Self-pay | Admitting: Internal Medicine

## 2020-11-15 DIAGNOSIS — R072 Precordial pain: Secondary | ICD-10-CM | POA: Diagnosis present

## 2020-11-15 DIAGNOSIS — I2510 Atherosclerotic heart disease of native coronary artery without angina pectoris: Secondary | ICD-10-CM

## 2020-11-15 DIAGNOSIS — Z006 Encounter for examination for normal comparison and control in clinical research program: Secondary | ICD-10-CM

## 2020-11-15 MED ORDER — NITROGLYCERIN 0.4 MG SL SUBL
0.8000 mg | SUBLINGUAL_TABLET | Freq: Once | SUBLINGUAL | Status: AC
  Administered 2020-11-15: 0.8 mg via SUBLINGUAL

## 2020-11-15 MED ORDER — IOHEXOL 350 MG/ML SOLN
80.0000 mL | Freq: Once | INTRAVENOUS | Status: AC | PRN
  Administered 2020-11-15: 80 mL via INTRAVENOUS

## 2020-11-15 MED ORDER — NITROGLYCERIN 0.4 MG SL SUBL
SUBLINGUAL_TABLET | SUBLINGUAL | Status: AC
  Filled 2020-11-15: qty 2

## 2020-11-15 NOTE — Research (Signed)
IDENTIFY Informed Consent                  Subject Name: Christopher [name]   Subject met inclusion and exclusion criteria.  The informed consent form, study requirements and expectations were reviewed with the subject and questions and concerns were addressed prior to the signing of the consent form.  The subject verbalized understanding of the trial requirements.  The subject agreed to participate in the IDENTIFY trial and signed the informed consent at 13:20PM on 11/15/20.  The informed consent was obtained prior to performance of any protocol-specific procedures for the subject.  A copy of the signed informed consent was given to the subject and a copy was placed in the subject's medical record.   Meade Maw, Naval architect

## 2020-11-15 NOTE — Telephone Encounter (Signed)
Pt called to inform Dr. Henrene Pastor that he had CT scan today that Dr. Fletcher Anon ordered. He was told that results will be ready for his appt with Dr. Henrene Pastor tomorrow.

## 2020-11-16 ENCOUNTER — Ambulatory Visit: Payer: BC Managed Care – PPO | Admitting: Internal Medicine

## 2020-11-16 ENCOUNTER — Other Ambulatory Visit: Payer: BC Managed Care – PPO | Admitting: *Deleted

## 2020-11-16 ENCOUNTER — Telehealth: Payer: Self-pay | Admitting: *Deleted

## 2020-11-16 ENCOUNTER — Other Ambulatory Visit: Payer: Self-pay

## 2020-11-16 ENCOUNTER — Telehealth: Payer: Self-pay | Admitting: Internal Medicine

## 2020-11-16 ENCOUNTER — Other Ambulatory Visit (HOSPITAL_COMMUNITY)
Admission: RE | Admit: 2020-11-16 | Discharge: 2020-11-16 | Disposition: A | Discharge status: Home / Self Care | Payer: BC Managed Care – PPO | Source: Ambulatory Visit | Attending: Cardiovascular Disease | Admitting: Cardiovascular Disease

## 2020-11-16 DIAGNOSIS — Z01810 Encounter for preprocedural cardiovascular examination: Secondary | ICD-10-CM

## 2020-11-16 DIAGNOSIS — Z20822 Contact with and (suspected) exposure to covid-19: Secondary | ICD-10-CM | POA: Diagnosis not present

## 2020-11-16 DIAGNOSIS — Z01812 Encounter for preprocedural laboratory examination: Secondary | ICD-10-CM | POA: Insufficient documentation

## 2020-11-16 DIAGNOSIS — R072 Precordial pain: Secondary | ICD-10-CM

## 2020-11-16 LAB — CBC WITH DIFFERENTIAL/PLATELET
Basophils Absolute: 0 10*3/uL (ref 0.0–0.2)
Basos: 0 %
EOS (ABSOLUTE): 0 10*3/uL (ref 0.0–0.4)
Eos: 1 %
Hematocrit: 40.9 % (ref 37.5–51.0)
Hemoglobin: 13.7 g/dL (ref 13.0–17.7)
Immature Grans (Abs): 0 10*3/uL (ref 0.0–0.1)
Immature Granulocytes: 0 %
Lymphocytes Absolute: 1.4 10*3/uL (ref 0.7–3.1)
Lymphs: 32 %
MCH: 29.3 pg (ref 26.6–33.0)
MCHC: 33.5 g/dL (ref 31.5–35.7)
MCV: 87 fL (ref 79–97)
Monocytes Absolute: 0.3 10*3/uL (ref 0.1–0.9)
Monocytes: 7 %
Neutrophils Absolute: 2.6 10*3/uL (ref 1.4–7.0)
Neutrophils: 60 %
Platelets: 163 10*3/uL (ref 150–450)
RBC: 4.68 x10E6/uL (ref 4.14–5.80)
RDW: 12.2 % (ref 11.6–15.4)
WBC: 4.4 10*3/uL (ref 3.4–10.8)

## 2020-11-16 LAB — BASIC METABOLIC PANEL
BUN/Creatinine Ratio: 12 (ref 10–24)
BUN: 12 mg/dL (ref 8–27)
CO2: 25 mmol/L (ref 20–29)
Calcium: 9.3 mg/dL (ref 8.6–10.2)
Chloride: 100 mmol/L (ref 96–106)
Creatinine, Ser: 1.04 mg/dL (ref 0.76–1.27)
GFR calc Af Amer: 86 mL/min/{1.73_m2} (ref >59)
GFR calc non Af Amer: 74 mL/min/{1.73_m2} (ref >59)
Glucose: 109 mg/dL — ABNORMAL HIGH (ref 65–99)
Potassium: 5.2 mmol/L (ref 3.5–5.2)
Sodium: 140 mmol/L (ref 134–144)

## 2020-11-16 LAB — SARS CORONAVIRUS 2 (TAT 6-24 HRS): SARS Coronavirus 2: NEGATIVE

## 2020-11-16 MED ORDER — ASPIRIN EC 81 MG PO TBEC: 81.0000 mg | DELAYED_RELEASE_TABLET | Freq: Every day | ORAL | 3 refills | Status: DC

## 2020-11-16 MED ORDER — ROSUVASTATIN CALCIUM 20 MG PO TABS: 20.0000 mg | ORAL_TABLET | Freq: Every day | ORAL | 3 refills | Status: DC

## 2020-11-16 NOTE — Telephone Encounter (Signed)
1.  This is a significant cardiac finding.  He needs to follow-up with his cardiologist 2.  Unfortunately he was late for his GI office today and had to reschedule as I have no openings this morning

## 2020-11-16 NOTE — Telephone Encounter (Signed)
Noted  

## 2020-11-16 NOTE — Telephone Encounter (Signed)
Spoke to patient and we discussed the below procedural information. He needed to get to another appointment so he will call me back to go over the COVID and lab process.    You are scheduled for a Cardiac Catheterization on Friday, February 25 with Dr. Kathlyn Sacramento.  1. Please arrive at the [facility] (Main Entrance A) at [facility]: [address] [place], [place] [address] at 8:30 AM (This time is two hours before your procedure to ensure your preparation). Free valet parking service is available.   Special note: Every effort is made to have your procedure done on time. Please understand that emergencies sometimes delay scheduled procedures.  2. Diet: Do not eat solid foods after midnight.  The patient may have clear liquids until 5am upon the day of the procedure.  3. Labs: You will need to have blood drawn on Wednesday, February 23 at [facility]. [address]  Open: 7:30am - 5pm    Phone: [phone]. You do not need to be fasting.  4. Medication instructions in preparation for your procedure:   Contrast Allergy: No  On the morning of your procedure, take your Aspirin and any morning medicines NOT listed above.  You may use sips of water.  5. Plan for one night stay--bring personal belongings. 6. Bring a current list of your medications and current insurance cards. 7. You MUST have a responsible person to drive you home. 8. Someone MUST be with you the first 24 hours after you arrive home or your discharge will be delayed. 9. Please wear clothes that are easy to get on and off and wear slip-on shoes.  Thank you for allowing [place] to care for you!   --  Invasive Cardiovascular services

## 2020-11-16 NOTE — Telephone Encounter (Signed)
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  The patient continues to have severe angina but he has no rest pain. Start aspirin 81 mg once daily and rosuvastatin 20 mg once daily. Schedule cardiac catheterization on Friday the 25th at [facility] with me at 10:30 AM.  I instructed him to go to the ED if he develops prolonged chest pain or chest pain at rest.

## 2020-11-16 NOTE — Telephone Encounter (Signed)
Called Christopher [name] back. He verbalized understanding to go to [address] to get lab work today. He is on his way there right now.  Appt added to schedule.  He will then proceed to the Covid testing site off [address] as stated below.  He verbalized understanding of the preprocedural instructions as well.   You will need to have the coronavirus test completed prior to your procedure. An appointment has been made at 11:30 am on  11/16/20. This is a Drive Up Visit at the [address] [place]. Please tell them that you are there for pre-procedure testing. Someone will direct you to the appropriate testing line. Stay in your car and someone will be with you shortly. Please make sure to have all other labs completed before this test because you will need to stay quarantined until your procedure. Please take your insurance card to this test.

## 2020-11-16 NOTE — Telephone Encounter (Signed)
Pt aware.

## 2020-11-16 NOTE — Telephone Encounter (Signed)
Patient is returning your call.  

## 2020-11-17 ENCOUNTER — Telehealth: Payer: Self-pay | Admitting: *Deleted

## 2020-11-17 NOTE — Telephone Encounter (Signed)
Pt contacted pre-catheterization scheduled at [facility] for: Friday November 18, 2020 10:30 AM Verified arrival time and place: [facility]) at: 8:30 AM   No solid food after midnight prior to cath, clear liquids until 5 AM day of procedure.   AM meds can be  taken pre-cath with sips of water including: ASA 81 mg   Confirmed patient has responsible adult to drive home post procedure and be with patient first 24 hours after arriving home: yes  You are allowed ONE visitor in the waiting room during the time you are at the hospital for your procedure. Both you and your visitor must wear a mask once you enter the hospital.   Reviewed procedure/mask/visitor instructions with patient.

## 2020-11-18 ENCOUNTER — Observation Stay (HOSPITAL_COMMUNITY)
Admission: RE | Admit: 2020-11-18 | Discharge: 2020-11-19 | Disposition: A | Discharge status: Home / Self Care | Payer: BC Managed Care – PPO | Attending: Cardiovascular Disease | Admitting: Cardiovascular Disease

## 2020-11-18 ENCOUNTER — Encounter (HOSPITAL_COMMUNITY): Payer: Self-pay | Admitting: Cardiovascular Disease

## 2020-11-18 ENCOUNTER — Encounter (HOSPITAL_COMMUNITY)
Admission: RE | Disposition: A | Discharge status: Home / Self Care | Payer: Self-pay | Source: Home / Self Care | Attending: Cardiovascular Disease

## 2020-11-18 ENCOUNTER — Other Ambulatory Visit: Payer: Self-pay

## 2020-11-18 DIAGNOSIS — R079 Chest pain, unspecified: Secondary | ICD-10-CM | POA: Diagnosis present

## 2020-11-18 DIAGNOSIS — I25110 Atherosclerotic heart disease of native coronary artery with unstable angina pectoris: Secondary | ICD-10-CM | POA: Diagnosis not present

## 2020-11-18 DIAGNOSIS — Z7982 Long term (current) use of aspirin: Secondary | ICD-10-CM | POA: Diagnosis not present

## 2020-11-18 DIAGNOSIS — Z955 Presence of coronary angioplasty implant and graft: Secondary | ICD-10-CM

## 2020-11-18 DIAGNOSIS — I2510 Atherosclerotic heart disease of native coronary artery without angina pectoris: Secondary | ICD-10-CM

## 2020-11-18 DIAGNOSIS — E785 Hyperlipidemia, unspecified: Secondary | ICD-10-CM

## 2020-11-18 DIAGNOSIS — I200 Unstable angina: Secondary | ICD-10-CM | POA: Diagnosis present

## 2020-11-18 HISTORY — PX: CORONARY STENT INTERVENTION: CATH118234

## 2020-11-18 HISTORY — PX: INTRAVASCULAR IMAGING/OCT: CATH118326

## 2020-11-18 HISTORY — DX: Atherosclerotic heart disease of native coronary artery without angina pectoris: I25.10

## 2020-11-18 HISTORY — PX: LEFT HEART CATH AND CORONARY ANGIOGRAPHY: CATH118249

## 2020-11-18 LAB — POCT ACTIVATED CLOTTING TIME
Activated Clotting Time: 440 seconds
Activated Clotting Time: 309 seconds

## 2020-11-18 SURGERY — LEFT HEART CATH AND CORONARY ANGIOGRAPHY
Anesthesia: LOCAL

## 2020-11-18 MED ORDER — HEPARIN (PORCINE) IN NACL 1000-0.9 UT/500ML-% IV SOLN
INTRAVENOUS | Status: AC
  Filled 2020-11-18: qty 500

## 2020-11-18 MED ORDER — ROSUVASTATIN CALCIUM 20 MG PO TABS
40.0000 mg | ORAL_TABLET | Freq: Every day | ORAL | Status: DC
  Administered 2020-11-19 (×2): 40 mg via ORAL
  Filled 2020-11-18 (×3): qty 2

## 2020-11-18 MED ORDER — SODIUM CHLORIDE 0.9 % WEIGHT BASED INFUSION: 64.0000 mL/h | INTRAVENOUS | Status: DC

## 2020-11-18 MED ORDER — NITROGLYCERIN IN D5W 200-5 MCG/ML-% IV SOLN
INTRAVENOUS | Status: AC | PRN
  Administered 2020-11-18: 10 ug/min via INTRAVENOUS

## 2020-11-18 MED ORDER — SODIUM CHLORIDE 0.9 % IV SOLN: 250.0000 mL | INTRAVENOUS | Status: DC | PRN

## 2020-11-18 MED ORDER — HEPARIN SODIUM (PORCINE) 1000 UNIT/ML IJ SOLN
INTRAMUSCULAR | Status: DC | PRN
  Administered 2020-11-18: 4000 [IU] via INTRAVENOUS
  Administered 2020-11-18: 3000 [IU] via INTRAVENOUS

## 2020-11-18 MED ORDER — TICAGRELOR 90 MG PO TABS
ORAL_TABLET | ORAL | Status: AC
  Filled 2020-11-18: qty 2

## 2020-11-18 MED ORDER — ONDANSETRON HCL 4 MG/2ML IJ SOLN: 4.0000 mg | Freq: Four times a day (QID) | INTRAMUSCULAR | Status: DC | PRN

## 2020-11-18 MED ORDER — SODIUM CHLORIDE 0.9% FLUSH: 3.0000 mL | INTRAVENOUS | Status: DC | PRN

## 2020-11-18 MED ORDER — LIDOCAINE HCL (PF) 1 % IJ SOLN
INTRAMUSCULAR | Status: DC | PRN
  Administered 2020-11-18: 2 mL

## 2020-11-18 MED ORDER — NITROGLYCERIN 1 MG/10 ML FOR IR/CATH LAB
INTRA_ARTERIAL | Status: AC
  Filled 2020-11-18: qty 10

## 2020-11-18 MED ORDER — SODIUM CHLORIDE 0.9% FLUSH
3.0000 mL | Freq: Two times a day (BID) | INTRAVENOUS | Status: DC
  Administered 2020-11-19: 3 mL via INTRAVENOUS

## 2020-11-18 MED ORDER — FENTANYL CITRATE (PF) 100 MCG/2ML IJ SOLN
INTRAMUSCULAR | Status: DC | PRN
  Administered 2020-11-18: 25 ug via INTRAVENOUS
  Administered 2020-11-18 (×2): 50 ug via INTRAVENOUS

## 2020-11-18 MED ORDER — METOPROLOL TARTRATE 25 MG PO TABS
25.0000 mg | ORAL_TABLET | Freq: Two times a day (BID) | ORAL | Status: DC
  Administered 2020-11-18 – 2020-11-19 (×3): 25 mg via ORAL
  Filled 2020-11-18 (×4): qty 1

## 2020-11-18 MED ORDER — MIDAZOLAM HCL 2 MG/2ML IJ SOLN
INTRAMUSCULAR | Status: DC | PRN
  Administered 2020-11-18 (×2): 1 mg via INTRAVENOUS

## 2020-11-18 MED ORDER — MIDAZOLAM HCL 2 MG/2ML IJ SOLN
INTRAMUSCULAR | Status: AC
  Filled 2020-11-18: qty 2

## 2020-11-18 MED ORDER — NITROGLYCERIN 1 MG/10 ML FOR IR/CATH LAB
INTRA_ARTERIAL | Status: DC | PRN
  Administered 2020-11-18 (×2): 200 ug via INTRACORONARY

## 2020-11-18 MED ORDER — PANTOPRAZOLE SODIUM 40 MG PO TBEC
40.0000 mg | DELAYED_RELEASE_TABLET | Freq: Every day | ORAL | Status: DC
  Administered 2020-11-18 – 2020-11-19 (×3): 40 mg via ORAL
  Filled 2020-11-18 (×3): qty 1

## 2020-11-18 MED ORDER — NITROGLYCERIN IN D5W 200-5 MCG/ML-% IV SOLN
INTRAVENOUS | Status: AC
  Filled 2020-11-18: qty 250

## 2020-11-18 MED ORDER — VERAPAMIL HCL 2.5 MG/ML IV SOLN
INTRAVENOUS | Status: AC
  Filled 2020-11-18: qty 2

## 2020-11-18 MED ORDER — ASPIRIN 81 MG PO CHEW: 81.0000 mg | CHEWABLE_TABLET | ORAL | Status: DC

## 2020-11-18 MED ORDER — FENTANYL CITRATE (PF) 100 MCG/2ML IJ SOLN
INTRAMUSCULAR | Status: AC
  Filled 2020-11-18: qty 2

## 2020-11-18 MED ORDER — TICAGRELOR 90 MG PO TABS
90.0000 mg | ORAL_TABLET | Freq: Two times a day (BID) | ORAL | Status: DC
  Administered 2020-11-18 – 2020-11-19 (×3): 90 mg via ORAL
  Filled 2020-11-18 (×3): qty 1

## 2020-11-18 MED ORDER — ASPIRIN EC 81 MG PO TBEC
81.0000 mg | DELAYED_RELEASE_TABLET | Freq: Every day | ORAL | Status: DC
  Administered 2020-11-19 (×2): 81 mg via ORAL
  Filled 2020-11-18 (×2): qty 1

## 2020-11-18 MED ORDER — VERAPAMIL HCL 2.5 MG/ML IV SOLN
INTRAVENOUS | Status: DC | PRN
  Administered 2020-11-18: 10 mL via INTRA_ARTERIAL

## 2020-11-18 MED ORDER — SODIUM CHLORIDE 0.9% FLUSH
3.0000 mL | Freq: Two times a day (BID) | INTRAVENOUS | Status: DC
  Administered 2020-11-18 – 2020-11-19 (×2): 3 mL via INTRAVENOUS

## 2020-11-18 MED ORDER — SODIUM CHLORIDE 0.9 % WEIGHT BASED INFUSION
191.0000 mL/h | INTRAVENOUS | Status: DC
  Administered 2020-11-18: 3 mL/kg/h via INTRAVENOUS

## 2020-11-18 MED ORDER — HEPARIN (PORCINE) IN NACL 1000-0.9 UT/500ML-% IV SOLN
INTRAVENOUS | Status: DC | PRN
  Administered 2020-11-18 (×2): 500 mL

## 2020-11-18 MED ORDER — SODIUM CHLORIDE 0.9 % WEIGHT BASED INFUSION: 1.0000 mL/kg/h | INTRAVENOUS | Status: AC

## 2020-11-18 MED ORDER — TICAGRELOR 90 MG PO TABS
ORAL_TABLET | ORAL | Status: DC | PRN
  Administered 2020-11-18: 180 mg via ORAL

## 2020-11-18 MED ORDER — IOHEXOL 350 MG/ML SOLN
INTRAVENOUS | Status: DC | PRN
  Administered 2020-11-18: 210 mL

## 2020-11-18 MED ORDER — NITROGLYCERIN IN D5W 200-5 MCG/ML-% IV SOLN: 0.0000 ug/min | INTRAVENOUS | Status: DC

## 2020-11-18 MED ORDER — ACETAMINOPHEN 325 MG PO TABS
650.0000 mg | ORAL_TABLET | ORAL | Status: DC | PRN
  Administered 2020-11-18 – 2020-11-19 (×2): 650 mg via ORAL
  Filled 2020-11-18 (×2): qty 2

## 2020-11-18 SURGICAL SUPPLY — 23 items
BAG SNAP BAND KOVER 36X36 (MISCELLANEOUS) ×1 IMPLANT
BALLN SAPPHIRE 2.0X15 (BALLOONS) ×2
BALLN SAPPHIRE ~~LOC~~ 3.0X18 (BALLOONS) ×1 IMPLANT
BALLN SAPPHIRE ~~LOC~~ 3.25X12 (BALLOONS) ×1 IMPLANT
BALLN ~~LOC~~ EMERGE MR 2.5X20 (BALLOONS) ×2
BALLOON SAPPHIRE 2.0X15 (BALLOONS) IMPLANT
BALLOON ~~LOC~~ EMERGE MR 2.5X20 (BALLOONS) IMPLANT
CATH DRAGONFLY OPSTAR (CATHETERS) ×1 IMPLANT
CATH INFINITI 5FR ANG PIGTAIL (CATHETERS) ×1 IMPLANT
CATH INFINITI 5FR JK (CATHETERS) ×1 IMPLANT
CATH LAUNCHER 6FR EBU3.5 (CATHETERS) ×1 IMPLANT
COVER DOME SNAP 22 D (MISCELLANEOUS) ×1 IMPLANT
DEVICE RAD TR BAND REGULAR (VASCULAR PRODUCTS) ×1 IMPLANT
GLIDESHEATH SLEND SS 6F .021 (SHEATH) ×1 IMPLANT
GUIDEWIRE INQWIRE 1.5J.035X260 (WIRE) IMPLANT
INQWIRE 1.5J .035X260CM (WIRE) ×2
KIT ENCORE 26 ADVANTAGE (KITS) ×1 IMPLANT
KIT HEART LEFT (KITS) ×2 IMPLANT
PACK CARDIAC CATHETERIZATION (CUSTOM PROCEDURE TRAY) ×2 IMPLANT
STENT RESOLUTE ONYX 2.75X30 (Permanent Stent) ×1 IMPLANT
TRANSDUCER W/STOPCOCK (MISCELLANEOUS) ×2 IMPLANT
TUBING CIL FLEX 10 FLL-RA (TUBING) ×2 IMPLANT
WIRE RUNTHROUGH .014X180CM (WIRE) ×1 IMPLANT

## 2020-11-18 NOTE — Interval H&P Note (Signed)
History and Physical Interval Note:  The patient symptoms have progressed since his last visit.  He is currently having symptoms at rest consistent with unstable angina.  Cardiac CTA showed critical LAD disease.Cath Lab Visit (complete for each Cath Lab visit)  Clinical Evaluation Leading to the Procedure:   ACS: No.  Non-ACS:    Anginal Classification: CCS IV  Anti-ischemic medical therapy: No Therapy  Non-Invasive Test Results: High-risk stress test findings: cardiac mortality >3%/year  Prior CABG: No previous CABG        11/18/2020 10:27 AM  [facility]  has presented today for surgery, with the diagnosis of severe stenosis in LAD/angina.  The various methods of treatment have been discussed with the patient and family. After consideration of risks, benefits and other options for treatment, the patient has consented to  Procedure(s): LEFT HEART CATH AND CORONARY ANGIOGRAPHY (N/A) as a surgical intervention.  The patient's history has been reviewed, patient examined, no change in status, stable for surgery.  I have reviewed the patient's chart and labs.  Questions were answered to the patient's satisfaction.     Christopher [name]

## 2020-11-19 DIAGNOSIS — I2510 Atherosclerotic heart disease of native coronary artery without angina pectoris: Secondary | ICD-10-CM

## 2020-11-19 DIAGNOSIS — I200 Unstable angina: Secondary | ICD-10-CM | POA: Diagnosis not present

## 2020-11-19 DIAGNOSIS — Z7982 Long term (current) use of aspirin: Secondary | ICD-10-CM | POA: Diagnosis not present

## 2020-11-19 DIAGNOSIS — I25110 Atherosclerotic heart disease of native coronary artery with unstable angina pectoris: Secondary | ICD-10-CM | POA: Diagnosis not present

## 2020-11-19 DIAGNOSIS — E785 Hyperlipidemia, unspecified: Secondary | ICD-10-CM

## 2020-11-19 LAB — CBC
HCT: 34.5 % — ABNORMAL LOW (ref 39.0–52.0)
Hemoglobin: 11.5 g/dL — ABNORMAL LOW (ref 13.0–17.0)
MCH: 29.3 pg (ref 26.0–34.0)
MCHC: 33.3 g/dL (ref 30.0–36.0)
MCV: 88 fL (ref 80.0–100.0)
Platelets: 130 10*3/uL — ABNORMAL LOW (ref 150–400)
RBC: 3.92 MIL/uL — ABNORMAL LOW (ref 4.22–5.81)
RDW: 12.4 % (ref 11.5–15.5)
WBC: 5.1 10*3/uL (ref 4.0–10.5)
nRBC: 0 % (ref 0.0–0.2)

## 2020-11-19 LAB — LIPID PANEL
Cholesterol: 122 mg/dL (ref 0–200)
HDL: 32 mg/dL — ABNORMAL LOW (ref >40)
LDL Cholesterol: 73 mg/dL (ref 0–99)
Total CHOL/HDL Ratio: 3.8 RATIO
Triglycerides: 85 mg/dL (ref <150)
VLDL: 17 mg/dL (ref 0–40)

## 2020-11-19 LAB — BASIC METABOLIC PANEL
Anion gap: 7 (ref 5–15)
BUN: 11 mg/dL (ref 8–23)
CO2: 24 mmol/L (ref 22–32)
Calcium: 8.4 mg/dL — ABNORMAL LOW (ref 8.9–10.3)
Chloride: 103 mmol/L (ref 98–111)
Creatinine, Ser: 1.09 mg/dL (ref 0.61–1.24)
GFR, Estimated: >60 mL/min (ref >60)
Glucose, Bld: 100 mg/dL — ABNORMAL HIGH (ref 70–99)
Potassium: 3.9 mmol/L (ref 3.5–5.1)
Sodium: 134 mmol/L — ABNORMAL LOW (ref 135–145)

## 2020-11-19 LAB — HEPATIC FUNCTION PANEL
ALT: 11 U/L (ref 0–44)
AST: 23 U/L (ref 15–41)
Albumin: 3.3 g/dL — ABNORMAL LOW (ref 3.5–5.0)
Alkaline Phosphatase: 32 U/L — ABNORMAL LOW (ref 38–126)
Bilirubin, Direct: 0.2 mg/dL (ref 0.0–0.2)
Indirect Bilirubin: 1.1 mg/dL — ABNORMAL HIGH (ref 0.3–0.9)
Total Bilirubin: 1.3 mg/dL — ABNORMAL HIGH (ref 0.3–1.2)
Total Protein: 5.4 g/dL — ABNORMAL LOW (ref 6.5–8.1)

## 2020-11-19 MED ORDER — THE SENSUOUS HEART BOOK
Freq: Once | Status: DC
  Filled 2020-11-19: qty 1

## 2020-11-19 MED ORDER — METOPROLOL TARTRATE 25 MG PO TABS: 25.0000 mg | ORAL_TABLET | Freq: Two times a day (BID) | ORAL | 2 refills | Status: DC

## 2020-11-19 MED ORDER — ROSUVASTATIN CALCIUM 40 MG PO TABS: 20.0000 mg | ORAL_TABLET | Freq: Every day | ORAL | 5 refills | Status: DC

## 2020-11-19 MED ORDER — TICAGRELOR 90 MG PO TABS: 90.0000 mg | ORAL_TABLET | Freq: Two times a day (BID) | ORAL | 11 refills | Status: DC

## 2020-11-19 MED ORDER — ROSUVASTATIN CALCIUM 40 MG PO TABS: 40.0000 mg | ORAL_TABLET | Freq: Every day | ORAL | 5 refills | Status: DC

## 2020-11-19 MED ORDER — NITROGLYCERIN 0.4 MG SL SUBL: 0.4000 mg | SUBLINGUAL_TABLET | SUBLINGUAL | 2 refills | Status: DC | PRN

## 2020-11-19 MED ORDER — ANGIOPLASTY BOOK
Freq: Once | Status: DC
  Filled 2020-11-19: qty 1

## 2020-11-19 NOTE — Progress Notes (Addendum)
CARDIAC REHAB PHASE I   PRE:  Rate/Rhythm: Sinus Rhythm 64  BP:    Sitting: 114/68     SaO2: 99%  MODE:  Ambulation: 650 ft   POST:  Rate/Rhythem: 66  BP:    Sitting: 133/66     SaO2: 100% Room Air 1100-1200 Patient ambulated independently in the hallway. Vital signs stable. Patient tolerated without chest pain. Patient back to bedside with call light with in reach. Reviewed exercise instructions RPE scale. End points of exercise. Dr Despina Pole says he probably will be more interested in virtual phase 2 cardiac rehab. Discussed importance of taking Brilinta. Use of sublingual nitroglycerin. When to call 911. Heart Healthy diet information reviewed with the patient. Has stent card.  Harrell Gave RN BSN

## 2020-11-19 NOTE — Discharge Summary (Cosign Needed Addendum)
Discharge Summary    Patient ID: EUCLID CASSETTA MRN: [id number]; DOB: [birth year]  Admit date: 11/18/2020 Discharge date: 11/19/2020  PCP:  Wenda Low, [place]  Cardiologist:  Kathlyn Sacramento, MD  Electrophysiologist:  None    Discharge Diagnoses    Principal Problem:   Unstable angina [facility]) Active Problems:   CAD (coronary artery disease)   Dyslipidemia    Diagnostic Studies/Procedures    Left Cardiac Catheterization 11/18/2020:  Prox Cx to Mid Cx lesion is 80% stenosed.  3rd Mrg lesion is 60% stenosed.  Mid LAD lesion is 95% stenosed.  Post intervention, there is a 15% residual stenosis.  A drug-eluting stent was successfully placed using a STENT RESOLUTE ONYX 2.75X30.  Dist LAD-1 lesion is 60% stenosed.  Dist LAD-2 lesion is 95% stenosed.  2nd Diag lesion is 40% stenosed.  Prox RCA lesion is 95% stenosed.  Mid RCA lesion is 80% stenosed.   1.  Left dominant coronary arteries with significant three-vessel coronary artery disease.  The culprit seems to be heavily calcified mid LAD disease.  The distal LAD also has significant stenosis.  The RCA is small and nondominant and has left-to-right collaterals. 2.  Normal LV systolic function and left ventricular end-diastolic pressure. 3.  Successful OCT guided angioplasty and drug-eluting stent placement to the mid LAD.  The procedure was very difficult due to calcifications.  There was 15% residual stenosis in the proximal segment of the stent in spite of high pressure noncompliant balloon inflation.  2 small diagonals were jailed by the stent with sluggish flow.  Not large enough to rescue and the patient was placed on a nitroglycerin drip.  Recommendations: CABG was considered but the distal LAD is not a good target.  In addition, the RCA is small and nondominant. We will admit for observation on a nitroglycerin drip. Recommend dual antiplatelet therapy for at least 6  months. The patient likely will require staged left circumflex PCI as an outpatient.  Diagnostic Dominance: Left    Intervention       _____________   History of Present Illness     Mr. Christopher [name] is a 65 year old male with a history of severe GERD and Raynaud's disease who was recently referred to Dr. Fletcher Anon on 10/25/2020 for further evaluation of exertional chest pain and epigastric pain. At that visit, patient reported he had symptoms of COVID in 08/2020 and was sick for about 10 days. However, rapid testing was negative for COVID. After that, he started having exertional epigastric discomfort radiating to substernal area, the left side of the chest, and his neck. He described the pain as a burning sensation. Coronary CTA was ordered and showed coronary calcium score of 767 (94th percentile for age and sex) and severe CAD with severe stenosis in the LAD and moderate stenoses in D1, ramus, LCX, and OM. He was started on Aspirin and Crestor 20mg  daily and cardiac catheterization was recommended.   Hospital Course     Consultants: None  Patient presented to [facility] on 11/18/2020 for outpatient cardiac catheterization. Cardiac catheterization showed significant 3 vessel CAD with 95% stenosis of mid LAD, 60% stenosis followed by 95% stenosis of distal LAD, 95% stenosis of proximal RCA followed by 80% stenosis of mid RCA, 80% stenosis of proximal to mid LCX, 60% stenosis of OM3, and 40% stenosis of 2nd Diag. CABG was considered but the distal LAD was not a good target and the RCA was small and  non-dominant. Patient underwent OCT guided angioplasty with DES to mid LAD lesions. The procedure was difficult to calcification. There was 15% residual stenosis in the proximal segment of the stent in spite of high pressure noncompliant balloon inflation and 2 small diagonals were jailed by the stent with sluggish flow. He was admitted overnight for observation on a Nitro drip. Plan is staged PCI to LCX  as outpatient. Patient tolerated the procedure well. Nitro was able to be weaned off without any chest pain. He was started on DAPT with Aspirin and Brilinta as well as Lopressor 25mg  twice daily. Crestor was increased to 40mg  daily. Will need repeat lipid panel and LFTs in 2 months.   Patient was seen and examined by Dr. Harl Bowie today and determined to be stable for discharge. Will arrange outpatient follow-up. Medications as below.   Did the patient have an acute coronary syndrome (MI, NSTEMI, STEMI, etc) this admission?:  No                               Did the patient have a percutaneous coronary intervention (stent / angioplasty)?:  Yes.     Cath/PCI Registry Performance & Quality Measures: 1. Aspirin prescribed? - Yes 2. ADP Receptor Inhibitor (Plavix/Clopidogrel, Brilinta/Ticagrelor or Effient/Prasugrel) prescribed (includes medically managed patients)? - Yes 3. High Intensity Statin (Lipitor 40-80mg  or Crestor 20-40mg ) prescribed? - Yes 4. For EF <40%, was ACEI/ARB prescribed? EF has not been assessed. 5. For EF <40%, Aldosterone Antagonist (Spironolactone or Eplerenone) prescribed? EF has not been assessed. 6. Cardiac Rehab Phase II ordered? - Yes       _____________  Discharge Vitals Blood pressure 115/64, pulse 60, temperature 97.6 F (36.4 C), temperature source Oral, resp. rate 19, height 5\' 8"  (1.727 m), weight 63.1 kg, SpO2 100 %.  Filed Weights   11/18/20 0859 11/19/20 0500  Weight: 63.5 kg 63.1 kg    Labs & Radiologic Studies    CBC Recent Labs    11/19/20 0236  WBC 5.1  HGB 11.5*  HCT 34.5*  MCV 88.0  PLT 751*   Basic Metabolic Panel Recent Labs    11/19/20 0236  NA 134*  K 3.9  CL 103  CO2 24  GLUCOSE 100*  BUN 11  CREATININE 1.09  CALCIUM 8.4*   Liver Function Tests Recent Labs    11/19/20 0236  AST 23  ALT 11  ALKPHOS 32*  BILITOT 1.3*  PROT 5.4*  ALBUMIN 3.3*   No results for input(s): LIPASE, AMYLASE in the last 72 hours. High  Sensitivity Troponin:   No results for input(s): TROPONINIHS in the last 720 hours.  BNP Invalid input(s): POCBNP D-Dimer No results for input(s): DDIMER in the last 72 hours. Hemoglobin A1C No results for input(s): HGBA1C in the last 72 hours. Fasting Lipid Panel Recent Labs    11/19/20 0236  CHOL 122  HDL 32*  LDLCALC 73  TRIG 85  CHOLHDL 3.8   Thyroid Function Tests No results for input(s): TSH, T4TOTAL, T3FREE, THYROIDAB in the last 72 hours.  Invalid input(s): FREET3 _____________  CARDIAC CATHETERIZATION  Addendum Date: 11/18/2020    Prox Cx to Mid Cx lesion is 80% stenosed.  3rd Mrg lesion is 60% stenosed.  Mid LAD lesion is 95% stenosed.  Post intervention, there is a 15% residual stenosis.  A drug-eluting stent was successfully placed using a STENT RESOLUTE ONYX 2.75X30.  Dist LAD-1 lesion is 60% stenosed.  Dist  LAD-2 lesion is 95% stenosed.  2nd Diag lesion is 40% stenosed.  Prox RCA lesion is 95% stenosed.  Mid RCA lesion is 80% stenosed.  1.  Left dominant coronary arteries with significant three-vessel coronary artery disease.  The culprit seems to be heavily calcified mid LAD disease.  The distal LAD also has significant stenosis.  The RCA is small and nondominant and has left-to-right collaterals. 2.  Normal LV systolic function and left ventricular end-diastolic pressure. 3.  Successful OCT guided angioplasty and drug-eluting stent placement to the mid LAD.  The procedure was very difficult due to calcifications.  There was 15% residual stenosis in the proximal segment of the stent in spite of high pressure noncompliant balloon inflation.  2 small diagonals were jailed by the stent with sluggish flow.  Not large enough to rescue and the patient was placed on a nitroglycerin drip. Recommendations: CABG was considered but the distal LAD is not a good target.  In addition, the RCA is small and nondominant. We will admit for observation on a nitroglycerin drip.  Recommend dual antiplatelet therapy for at least 6 months. The patient likely will require staged left circumflex PCI as an outpatient.   Result Date: 11/18/2020  Prox Cx to Mid Cx lesion is 80% stenosed.  3rd Mrg lesion is 60% stenosed.  Mid LAD lesion is 95% stenosed.  Post intervention, there is a 15% residual stenosis.  A drug-eluting stent was successfully placed using a STENT RESOLUTE ONYX 2.75X30.  Dist LAD-1 lesion is 60% stenosed.  Dist LAD-2 lesion is 95% stenosed.  2nd Diag lesion is 40% stenosed.  Prox RCA lesion is 95% stenosed.  Mid RCA lesion is 80% stenosed.  1.  Left dominant coronary arteries with significant three-vessel coronary artery disease.  The culprit seems to be heavily calcified mid LAD disease.  The distal LAD also has significant stenosis.  The RCA is small and nondominant and has left-to-right collaterals. 2.  Normal LV systolic function and left ventricular end-diastolic pressure. 3.  Successful OCT guided angioplasty and drug-eluting stent placement to the mid LAD.  The procedure was very difficult due to calcifications.  There was 15% residual stenosis in the proximal segment of the stent in spite of high pressure noncompliant balloon inflation.  2 small diagonals were jailed by the stent with sluggish flow.  Not large enough to rescue and the patient was placed on a nitroglycerin drip. Recommendations: We will admit for observation on a nitroglycerin drip. Recommend dual antiplatelet therapy for at least 6 months. The patient likely will require staged left circumflex PCI as an outpatient.   CT CORONARY MORPH W/CTA COR W/SCORE W/CA W/CM &/OR WO/CM  Addendum Date: 11/15/2020   ADDENDUM REPORT: 11/15/2020 17:50 CLINICAL DATA:  65 yo male with chest pain EXAM: Cardiac/Coronary  CT TECHNIQUE: The patient was scanned on a [employer]. FINDINGS: A 120 kV prospective scan was triggered in the descending thoracic aorta at 111 HU's. Axial non-contrast 3 mm slices  were carried out through the heart. The data set was analyzed on a dedicated work station and scored using the [place]. Gantry rotation speed was 250 msecs and collimation was .6 mm. No beta blockade and 0.8 mg of sl NTG was given. The 3D data set was reconstructed in 5% intervals of the 67-82 % of the R-R cycle. Diastolic phases were analyzed on a dedicated work station using MPR, MIP and VRT modes. The patient received 80 cc of contrast. Aorta:  Normal size.  No calcifications.  No dissection. Aortic Valve:  Trileaflet.  No calcifications. Coronary Arteries:  Normal coronary origin.  Left dominance. RCA is a nondominant artery. There is minimal (0-24%) calcified plaque in the proximal vessel and mild (25-49%) calcified stenosis in the mid vessel. Left main is a large artery that gives rise to LAD, ramus intermedius and LCX arteries. LAD is a large vessel that gives rise to D1. There is minimal (0-24%) calcified stenoses in the ostial and proximal vessel; there is long, severe (75-99%) mixed plaque stenosis in the mid vessel followed by severe (75-99%) soft plaque stenosis (may be occluded); distal moderate (50-69%) calcified stenosis. D1 with moderate (50-69%) calcified stenosis. Ramus intermedius with moderate (50-60%) calcified stenosis. LCX is a dominant artery that gives rise to OM and PDA. There is moderate (50-69%) stenoses in the distal Lcx and and PDA. There is moderate (50-69%) soft plaque stenosis in the mid OM and multiple moderate (50-69%) stenoses in the inferior branch of the OM. Other findings: Normal pulmonary vein drainage into the left atrium. Normal let atrial appendage without a thrombus. Normal size of the pulmonary artery. IMPRESSION: 1. Coronary calcium score of 767. This was 19 percentile for age and sex matched control. 2. Normal coronary origin with left dominance. 3. Severe CAD with severe stenoses in the LAD and moderate stenoses in D1, ramus, Lcx and OM. 4. Recommend cardiac  catheterization. Kirk Ruths Electronically Signed   By: Kirk Ruths M.D.   On: 11/15/2020 17:50   Result Date: 11/15/2020 EXAM: OVER-READ INTERPRETATION  CT CHEST The following report is an over-read performed by radiologist Dr. Vinnie Langton of [facility], [place] on 11/15/2020. This over-read does not include interpretation of cardiac or coronary anatomy or pathology. The coronary calcium score/coronary CTA interpretation by the cardiologist is attached. COMPARISON:  None. FINDINGS: A few scattered tiny 1-3 mm pulmonary nodules are clustered in the right middle lobe. Within the visualized portions of the thorax there are no suspicious appearing pulmonary nodules or masses, there is no acute consolidative airspace disease, no pleural effusions, no pneumothorax and no lymphadenopathy. Visualized portions of the upper abdomen are unremarkable. There are no aggressive appearing lytic or blastic lesions noted in the visualized portions of the skeleton. IMPRESSION: 1. Clustered 1-3 mm pulmonary nodules in the right middle lobe, nonspecific, but statistically likely benign. No follow-up needed if patient is low-risk (and has no known or suspected primary neoplasm). Non-contrast chest CT can be considered in 12 months if patient is high-risk. This recommendation follows the consensus statement: Guidelines for Management of Incidental Pulmonary Nodules Detected on CT Images: From the Fleischner Society 2017; Radiology 2017; 284:228-243. Electronically Signed: By: Vinnie Langton M.D. On: 11/15/2020 14:41   Disposition   Patient is being discharged home today in good condition.  Follow-up Plans & Appointments     Discharge Instructions    Amb Referral to Cardiac Rehabilitation   Complete by: As directed    Diagnosis: Coronary Stents   After initial evaluation and assessments completed: Virtual Based Care may be provided alone or in conjunction with Phase 2 Cardiac Rehab based on patient  barriers.: Yes   Diet - low sodium heart healthy   Complete by: As directed    Increase activity slowly   Complete by: As directed       Discharge Medications   Allergies as of 11/19/2020      Reactions   Azithromycin Other (See Comments)   Stomach cramps  Medication List    TAKE these medications   aspirin EC 81 MG tablet Take 1 tablet (81 mg total) by mouth daily. Swallow whole.   metoprolol tartrate 25 MG tablet Commonly known as: LOPRESSOR Take 1 tablet (25 mg total) by mouth 2 (two) times daily.   nitroGLYCERIN 0.4 MG SL tablet Commonly known as: Nitrostat Place 1 tablet (0.4 mg total) under the tongue every 5 (five) minutes as needed for chest pain.   pantoprazole 40 MG tablet Commonly known as: PROTONIX Take 40 mg by mouth daily.   rosuvastatin 40 MG tablet Commonly known as: Crestor Take 1 tablet (40 mg total) by mouth daily. What changed:   medication strength  how much to take   ticagrelor 90 MG Tabs tablet Commonly known as: BRILINTA Take 1 tablet (90 mg total) by mouth 2 (two) times daily.          Outstanding Labs/Studies   Repeat lipid panel and LFTs in 6-8 weeks.  Duration of Discharge Encounter   Greater than 30 minutes including physician time.  Signed, Darreld Mclean, PA-C 11/19/2020, 1:25 PM

## 2020-11-19 NOTE — Plan of Care (Signed)
  Problem: Education: ?Goal: Knowledge of General Education information will improve ?Description: Including pain rating scale, medication(s)/side effects and non-pharmacologic comfort measures ?Outcome: Adequate for Discharge ?  ?Problem: Education: ?Goal: Understanding of CV disease, CV risk reduction, and recovery process will improve ?Outcome: Adequate for Discharge ?Goal: Individualized Educational Video(s) ?Outcome: Adequate for Discharge ?  ?Problem: Activity: ?Goal: Ability to return to baseline activity level will improve ?Outcome: Adequate for Discharge ?  ?Problem: Cardiovascular: ?Goal: Ability to achieve and maintain adequate cardiovascular perfusion will improve ?Outcome: Adequate for Discharge ?Goal: Vascular access site(s) Level 0-1 will be maintained ?Outcome: Adequate for Discharge ?  ?Problem: Health Behavior/Discharge Planning: ?Goal: Ability to safely manage health-related needs after discharge will improve ?Outcome: Adequate for Discharge ?  ?

## 2020-11-19 NOTE — Care Management (Signed)
Patient discharging home on Brilinta; free 30 day trial card and copay card given to patient.  He is appreciative of assistance.  Reinaldo Raddle, RN, BSN  Trauma/Neuro ICU Case Manager [phone]

## 2020-11-19 NOTE — Discharge Instructions (Signed)
Medication Changes: - START Brilinta 90mg  twice daily in addition to the Aspirin 81mg  daily which you are already taking. These medications are very important in helping keep the stent in your heart open.  - START Metoprolol tartrate 25mg  twice daily. - INCREASE Rosuvastatin (Crestor) to 40mg  daily.  Continue all other medications as listed else where on discharge summary.  Post Cardiac Catheterization: NO HEAVY LIFTING OR SEXUAL ACTIVITY X 7 DAYS. NO DRIVING X 3-5 DAYS. NO SOAKING BATHS, HOT TUBS, POOLS, ETC., X 7 DAYS.  Radial Site Care: Refer to this sheet in the next few weeks. These instructions provide you with information on caring for yourself after your procedure. Your caregiver may also give you more specific instructions. Your treatment has been planned according to current medical practices, but problems sometimes occur. Call your caregiver if you have any problems or questions after your procedure. HOME CARE INSTRUCTIONS  You may shower the day after the procedure.Remove the bandage (dressing) and gently wash the site with plain soap and water.Gently pat the site dry.   Do not apply powder or lotion to the site.   Do not submerge the affected site in water for 3 to 5 days.   Inspect the site at least twice daily.   Do not flex or bend the affected arm for 24 hours.   No lifting over 5 pounds (2.3 kg) for 5 days after your procedure.   Do not drive home if you are discharged the same day of the procedure. Have someone else drive you.  What to expect:  Any bruising will usually fade within 1 to 2 weeks.   Blood that collects in the tissue (hematoma) may be painful to the touch. It should usually decrease in size and tenderness within 1 to 2 weeks.  SEEK IMMEDIATE MEDICAL CARE IF:  You have unusual pain at the radial site.   You have redness, warmth, swelling, or pain at the radial site.   You have drainage (other than a small amount of blood on the dressing).    You have chills.   You have a fever or persistent symptoms for more than 72 hours.   You have a fever and your symptoms suddenly get worse.   Your arm becomes pale, cool, tingly, or numb.   You have heavy bleeding from the site. Hold pressure on the site.

## 2020-11-19 NOTE — Progress Notes (Signed)
Progress Note  Patient Name: Christopher [name] Date of Encounter: 11/19/2020  [facility] Cardiologist: Dr Fletcher Anon  Subjective   Mild chest pain around 2AM that resolved, no recurrence  Inpatient Medications    Scheduled Meds: . angioplasty book   Does not apply Once  . aspirin EC  81 mg Oral Daily  . metoprolol tartrate  25 mg Oral BID  . pantoprazole  40 mg Oral Daily  . rosuvastatin  40 mg Oral Daily  . sodium chloride flush  3 mL Intravenous Q12H  . sodium chloride flush  3 mL Intravenous Q12H  . the sensuous heart book   Does not apply Once  . ticagrelor  90 mg Oral BID   Continuous Infusions: . sodium chloride    . nitroGLYCERIN 5 mcg/min (11/19/20 0707)   PRN Meds: sodium chloride, acetaminophen, ondansetron (ZOFRAN) IV, sodium chloride flush   Vital Signs    Vitals:   11/19/20 0100 11/19/20 0200 11/19/20 0300 11/19/20 0500  BP: (!) 99/55 103/61 (!) 95/58 105/84  Pulse:  76 74 76  Resp:    20  Temp:    98.3 F (36.8 C)  TempSrc:    Oral  SpO2:  99% 97% 99%  Weight:    63.1 kg  Height:        Intake/Output Summary (Last 24 hours) at 11/19/2020 0921 Last data filed at 11/19/2020 0100 Gross per 24 hour  Intake 8.9 ml  Output 920 ml  Net -911.1 ml   Last 3 Weights 11/19/2020 11/18/2020 10/25/2020  Weight (lbs) 139 lb 1.6 oz 140 lb 144 lb  Weight (kg) 63.095 kg 63.504 kg 65.318 kg      Telemetry    NSR- Personally Reviewed  ECG    n/a- Personally Reviewed  Physical Exam   GEN: No acute distress.   Neck: No JVD Cardiac: RRR, no murmurs, rubs, or gallops.  Respiratory: Clear to auscultation bilaterally. GI: Soft, nontender, non-distended  MS: No edema; No deformity. Neuro:  Nonfocal  Psych: Normal affect   Labs    High Sensitivity Troponin:  No results for input(s): TROPONINIHS in the last 720 hours.    Chemistry Recent Labs  Lab 11/16/20 1127 11/19/20 0236  NA 140 134*  K 5.2 3.9  CL 100 103  CO2 25 24  GLUCOSE 109* 100*  BUN 12  11  CREATININE 1.04 1.09  CALCIUM 9.3 8.4*  PROT  --  5.4*  ALBUMIN  --  3.3*  AST  --  23  ALT  --  11  ALKPHOS  --  32*  BILITOT  --  1.3*  GFRNONAA 74 >60  GFRAA 86  --   ANIONGAP  --  7     Hematology Recent Labs  Lab 11/16/20 1127 11/19/20 0236  WBC 4.4 5.1  RBC 4.68 3.92*  HGB 13.7 11.5*  HCT 40.9 34.5*  MCV 87 88.0  MCH 29.3 29.3  MCHC 33.5 33.3  RDW 12.2 12.4  PLT 163 130*    BNPNo results for input(s): BNP, PROBNP in the last 168 hours.   DDimer No results for input(s): DDIMER in the last 168 hours.   Radiology    CARDIAC CATHETERIZATION  Addendum Date: 11/18/2020    Prox Cx to Mid Cx lesion is 80% stenosed.  3rd Mrg lesion is 60% stenosed.  Mid LAD lesion is 95% stenosed.  Post intervention, there is a 15% residual stenosis.  A drug-eluting stent was successfully placed using a STENT RESOLUTE  ONYX 2.75X30.  Dist LAD-1 lesion is 60% stenosed.  Dist LAD-2 lesion is 95% stenosed.  2nd Diag lesion is 40% stenosed.  Prox RCA lesion is 95% stenosed.  Mid RCA lesion is 80% stenosed.  1.  Left dominant coronary arteries with significant three-vessel coronary artery disease.  The culprit seems to be heavily calcified mid LAD disease.  The distal LAD also has significant stenosis.  The RCA is small and nondominant and has left-to-right collaterals. 2.  Normal LV systolic function and left ventricular end-diastolic pressure. 3.  Successful OCT guided angioplasty and drug-eluting stent placement to the mid LAD.  The procedure was very difficult due to calcifications.  There was 15% residual stenosis in the proximal segment of the stent in spite of high pressure noncompliant balloon inflation.  2 small diagonals were jailed by the stent with sluggish flow.  Not large enough to rescue and the patient was placed on a nitroglycerin drip. Recommendations: CABG was considered but the distal LAD is not a good target.  In addition, the RCA is small and nondominant. We will admit  for observation on a nitroglycerin drip. Recommend dual antiplatelet therapy for at least 6 months. The patient likely will require staged left circumflex PCI as an outpatient.   Result Date: 11/18/2020  Prox Cx to Mid Cx lesion is 80% stenosed.  3rd Mrg lesion is 60% stenosed.  Mid LAD lesion is 95% stenosed.  Post intervention, there is a 15% residual stenosis.  A drug-eluting stent was successfully placed using a STENT RESOLUTE ONYX 2.75X30.  Dist LAD-1 lesion is 60% stenosed.  Dist LAD-2 lesion is 95% stenosed.  2nd Diag lesion is 40% stenosed.  Prox RCA lesion is 95% stenosed.  Mid RCA lesion is 80% stenosed.  1.  Left dominant coronary arteries with significant three-vessel coronary artery disease.  The culprit seems to be heavily calcified mid LAD disease.  The distal LAD also has significant stenosis.  The RCA is small and nondominant and has left-to-right collaterals. 2.  Normal LV systolic function and left ventricular end-diastolic pressure. 3.  Successful OCT guided angioplasty and drug-eluting stent placement to the mid LAD.  The procedure was very difficult due to calcifications.  There was 15% residual stenosis in the proximal segment of the stent in spite of high pressure noncompliant balloon inflation.  2 small diagonals were jailed by the stent with sluggish flow.  Not large enough to rescue and the patient was placed on a nitroglycerin drip. Recommendations: We will admit for observation on a nitroglycerin drip. Recommend dual antiplatelet therapy for at least 6 months. The patient likely will require staged left circumflex PCI as an outpatient.    Cardiac Studies    Patient Profile     65 y.o. male with recent exertional chest pain. Outpatient coronary CTA showed severe disease in the LAD with moderate disease of the D1, ramus, LCX, and OM. Presented for outpatient cath.   Assessment & Plan    1. Chest pain/CAD - outpatient coronary CTA with severe disease in the LAD with  moderate disease of the D1, ramus, LCX, and OM.  - referred for cath - cath yesterday with mid LAD 95%, D1 60%, D2 95%, ramus mod diff disease, OM3 60%, prox RCA 95% and mid 80%, distal RCA fills by collaterals. RCA small nondominant. LCX prox to mid 80% - DES to mid LAD, difficult procedure due to heavy calcification, 15% residual stenosis in the proximal segment of the stent in spite of high  pressure noncompliant balloon inflation. 2 small diagonals were jailed by the stent with sluggish flow, not large enough to rescue - plans for stage PCI of LCX as out patient   - post cah labs are stable.  - medical therapy with ASA 81, lopressor 25mg  bid, nitro gtt, crestor 40, brillinta 90mg  bid. Soft bp's at times, consider ACE/ARB as outpatient  D/c NG drip and monitor this morning, possible d/c later today if no recurrence   For questions or updates, please contact [place] Please consult [email or web address] for contact info under        Signed, Carlyle Dolly, MD  11/19/2020, 9:21 AM

## 2020-11-21 ENCOUNTER — Telehealth: Payer: Self-pay | Admitting: Cardiovascular Disease

## 2020-11-21 ENCOUNTER — Encounter (HOSPITAL_COMMUNITY): Payer: Self-pay | Admitting: Cardiovascular Disease

## 2020-11-21 NOTE — Telephone Encounter (Signed)
Attempted to schedule.  LMOV to call office.  ° °

## 2020-11-21 NOTE — Telephone Encounter (Signed)
-----   Message from Darreld Mclean, [place] sent at 11/19/2020 12:40 PM EST ----- Regarding: Hospital Follow-UP Patient was discharged from the hospital today. He needs follow-up with Dr. Fletcher Anon or APP in 1-2 weeks. Can you please call him to help arrange this?  Thank you! Callie

## 2020-11-25 ENCOUNTER — Encounter (HOSPITAL_COMMUNITY): Payer: Self-pay

## 2020-12-08 ENCOUNTER — Encounter: Payer: Self-pay | Admitting: Cardiovascular Disease

## 2020-12-08 ENCOUNTER — Telehealth: Payer: Self-pay

## 2020-12-08 ENCOUNTER — Ambulatory Visit (INDEPENDENT_AMBULATORY_CARE_PROVIDER_SITE_OTHER): Payer: BC Managed Care – PPO | Admitting: Cardiovascular Disease

## 2020-12-08 ENCOUNTER — Other Ambulatory Visit: Payer: Self-pay

## 2020-12-08 VITALS — BP 104/60 | HR 71 | Ht 68.0 in | Wt 142.4 lb

## 2020-12-08 DIAGNOSIS — I25118 Atherosclerotic heart disease of native coronary artery with other forms of angina pectoris: Secondary | ICD-10-CM | POA: Diagnosis not present

## 2020-12-08 DIAGNOSIS — E785 Hyperlipidemia, unspecified: Secondary | ICD-10-CM

## 2020-12-08 MED ORDER — METOPROLOL TARTRATE 25 MG PO TABS: 12.5000 mg | ORAL_TABLET | Freq: Two times a day (BID) | ORAL | 1 refills | Status: DC

## 2020-12-08 NOTE — H&P (View-Only) (Signed)
Cardiology Office Note   Date:  12/08/2020   ID:  Christopher [name], DOB 01/06/1956, MRN [id number]  PCP:  Wenda Low, MD  Cardiologist:   Kathlyn Sacramento, MD   Chief Complaint  Patient presents with  . Other    F/u cardiac cath c/o occasional chest discomfort. Meds reviewed verbally with pt.      History of Present Illness: Christopher [name] is a 65 y.o. male who presents for follow-up visit regarding coronary artery disease.  He has known history of GERD and Raynaud's disease.  He is not a smoker.  He does have family history of premature coronary artery disease. He works as a Network engineer at [employer]. He had previous lower extremity arterial Doppler in 2018 that was normal. He was seen recently for exertional burning sensation in the chest and epigastric area radiating to the neck.  Cardiac CTA was done which showed a calcium score of 767 with evidence of severe stenosis in the LAD and moderate stenosis in first diagonal, ramus, left circumflex and OM. I proceeded with cardiac catheterization last month which showed left dominant coronary arteries with significant three-vessel coronary artery disease.  The culprit felt to be heavily calcified severe mid LAD stenosis.  In addition, there was significant stenosis in the distal LAD.  The RCA was small nondominant but occluded with left-to-right collaterals.  The left circumflex had 80% stenosis.  I performed successful angioplasty and drug-eluting stent placement to the mid LAD.  The procedure was very difficult due to calcifications.  There was 15% residual stenosis in the proximal segment of the stent in spite of high pressure balloon.  2 small diagonals were jailed by the stent with sluggish flow.  He reports significant improvement in symptoms but he does feel sluggish in the morning.  Most of his chest pain resolved although he had some minor episodes.  He has not started cardiac rehab.  Past Medical History:  Diagnosis Date  .  Anxiety   . Cervical disc disease   . Coronary artery disease   . GERD (gastroesophageal reflux disease)    triggers by eating spicy food per pt  . Raynaud's disease   . Vitamin B 12 deficiency   . Vitamin D deficiency     Past Surgical History:  Procedure Laterality Date  . COLONOSCOPY  2011   Johnson-normal exam per pt  . CORONARY STENT INTERVENTION  11/18/2020  . CORONARY STENT INTERVENTION N/A 11/18/2020   Procedure: CORONARY STENT INTERVENTION;  Surgeon: Wellington Hampshire, MD;  Location: [place] [facility];  Service: Cardiovascular;  Laterality: N/A;  . infertility surgery    . INTRAVASCULAR IMAGING/OCT N/A 11/18/2020   Procedure: INTRAVASCULAR IMAGING/OCT;  Surgeon: Wellington Hampshire, MD;  Location: [place] [facility];  Service: Cardiovascular;  Laterality: N/A;  . LEFT HEART CATH AND CORONARY ANGIOGRAPHY N/A 11/18/2020   Procedure: LEFT HEART CATH AND CORONARY ANGIOGRAPHY;  Surgeon: Wellington Hampshire, MD;  Location: [place] [facility];  Service: Cardiovascular;  Laterality: N/A;  . NECK SURGERY  2007  . UPPER GASTROINTESTINAL ENDOSCOPY  2020   Ganem-h.pylori per pt     Current Outpatient Medications  Medication Sig Dispense Refill  . aspirin EC 81 MG tablet Take 1 tablet (81 mg total) by mouth daily. Swallow whole. 90 tablet 3  . metoprolol tartrate (LOPRESSOR) 25 MG tablet Take 1 tablet (25 mg total) by mouth 2 (two) times daily. 60 tablet 2  . nitroGLYCERIN (NITROSTAT) 0.4 MG SL  tablet Place 1 tablet (0.4 mg total) under the tongue every 5 (five) minutes as needed for chest pain. 25 tablet 2  . rosuvastatin (CRESTOR) 40 MG tablet Take 1 tablet (40 mg total) by mouth daily. 30 tablet 5  . ticagrelor (BRILINTA) 90 MG TABS tablet Take 1 tablet (90 mg total) by mouth 2 (two) times daily. 60 tablet 11   No current facility-administered medications for this visit.    Allergies:   Azithromycin    Social History:  The patient  reports that he has never smoked. He has never  used smokeless tobacco. He reports previous alcohol use. He reports previous drug use.   Family History:  The patient's family history includes CAD in his father; Diabetes in his mother; Heart disease (age of onset: 70) in his mother; Hyperlipidemia in his mother; Hypertension in his mother; Stroke in his father.    ROS:  Please see the history of present illness.   Otherwise, review of systems are positive for none.   All other systems are reviewed and negative.    PHYSICAL EXAM: VS:  BP 104/60 (BP Location: Left Arm, Patient Position: Sitting, Cuff Size: Normal)   Pulse 71   Ht 5\' 8"  (1.727 m)   Wt 142 lb 6 oz (64.6 kg)   SpO2 98%   BMI 21.65 kg/m  , BMI Body mass index is 21.65 kg/m. GEN: Well nourished, well developed, in no acute distress  HEENT: normal  Neck: no JVD, carotid bruits, or masses Cardiac: RRR; no murmurs, rubs, or gallops,no edema  Respiratory:  clear to auscultation bilaterally, normal work of breathing GI: soft, nontender, nondistended, + BS MS: no deformity or atrophy  Skin: warm and dry, no rash Neuro:  Strength and sensation are intact Psych: euthymic mood, full affect Right radial pulses normal with no hematoma.  EKG:  EKG is ordered today. The ekg ordered today demonstrates normal sinus rhythm with no significant ST or T wave changes.   Recent Labs: 11/19/2020: ALT 11; BUN 11; Creatinine, Ser 1.09; Hemoglobin 11.5; Platelets 130; Potassium 3.9; Sodium 134    Lipid Panel    Component Value Date/Time   CHOL 122 11/19/2020 0236   TRIG 85 11/19/2020 0236   HDL 32 (L) 11/19/2020 0236   CHOLHDL 3.8 11/19/2020 0236   VLDL 17 11/19/2020 0236   LDLCALC 73 11/19/2020 0236      Wt Readings from Last 3 Encounters:  12/08/20 142 lb 6 oz (64.6 kg)  11/19/20 139 lb 1.6 oz (63.1 kg)  10/25/20 144 lb (65.3 kg)        PAD Screen 10/25/2020  Previous PAD dx? No  Previous surgical procedure? No  Pain with walking? No  Feet/toe relief with dangling? No   Painful, non-healing ulcers? No  Extremities discolored? Yes      ASSESSMENT AND PLAN:  1.  Coronary artery disease involving native coronary arteries with stable angina: Significant improvement in symptoms after recent angioplasty and drug-eluting stent placement to the LAD.  He continues have significant disease in the left circumflex and given continued mild anginal symptoms, recommend proceeding with staged left circumflex PCI.  I discussed the procedure in details as well as risk and benefits. He does report feeling sluggish especially in the morning could be related to treatment with metoprolol.  I elected to decrease the dose to 12.5 mg twice daily.  2.  Hyperlipidemia: Lipid profile showed an LDL of 73 but given the patient's heavily calcified vessels and significant coronary  artery disease, he was started on a statin.  I suspect that he has other metabolic abnormalities leading to his aggressive atherosclerosis.  Continue high-dose rosuvastatin.  We should consider detailed lipid testing upon follow-up.  3.  GERD: He reports that most of his symptoms has resolved since PCI and its possible that some of his GERD symptoms were related to angina.    Disposition:   Follow-up with me in 1 month.  Signed,  Kathlyn Sacramento, MD  12/08/2020 9:39 AM    [facility]

## 2020-12-08 NOTE — Progress Notes (Signed)
Cardiology Office Note   Date:  12/08/2020   ID:  Christopher [name], DOB 04-16-1956, MRN [id number]  PCP:  Wenda Low, MD  Cardiologist:   Kathlyn Sacramento, MD   Chief Complaint  Patient presents with  . Other    F/u cardiac cath c/o occasional chest discomfort. Meds reviewed verbally with pt.      History of Present Illness: Christopher [name] is a 65 y.o. male who presents for follow-up visit regarding coronary artery disease.  He has known history of GERD and Raynaud's disease.  He is not a smoker.  He does have family history of premature coronary artery disease. He works as a Network engineer at [employer]. He had previous lower extremity arterial Doppler in 2018 that was normal. He was seen recently for exertional burning sensation in the chest and epigastric area radiating to the neck.  Cardiac CTA was done which showed a calcium score of 767 with evidence of severe stenosis in the LAD and moderate stenosis in first diagonal, ramus, left circumflex and OM. I proceeded with cardiac catheterization last month which showed left dominant coronary arteries with significant three-vessel coronary artery disease.  The culprit felt to be heavily calcified severe mid LAD stenosis.  In addition, there was significant stenosis in the distal LAD.  The RCA was small nondominant but occluded with left-to-right collaterals.  The left circumflex had 80% stenosis.  I performed successful angioplasty and drug-eluting stent placement to the mid LAD.  The procedure was very difficult due to calcifications.  There was 15% residual stenosis in the proximal segment of the stent in spite of high pressure balloon.  2 small diagonals were jailed by the stent with sluggish flow.  He reports significant improvement in symptoms but he does feel sluggish in the morning.  Most of his chest pain resolved although he had some minor episodes.  He has not started cardiac rehab.  Past Medical History:  Diagnosis Date  .  Anxiety   . Cervical disc disease   . Coronary artery disease   . GERD (gastroesophageal reflux disease)    triggers by eating spicy food per pt  . Raynaud's disease   . Vitamin B 12 deficiency   . Vitamin D deficiency     Past Surgical History:  Procedure Laterality Date  . COLONOSCOPY  2011   Johnson-normal exam per pt  . CORONARY STENT INTERVENTION  11/18/2020  . CORONARY STENT INTERVENTION N/A 11/18/2020   Procedure: CORONARY STENT INTERVENTION;  Surgeon: Wellington Hampshire, MD;  Location: [place] [facility];  Service: Cardiovascular;  Laterality: N/A;  . infertility surgery    . INTRAVASCULAR IMAGING/OCT N/A 11/18/2020   Procedure: INTRAVASCULAR IMAGING/OCT;  Surgeon: Wellington Hampshire, MD;  Location: [place] [facility];  Service: Cardiovascular;  Laterality: N/A;  . LEFT HEART CATH AND CORONARY ANGIOGRAPHY N/A 11/18/2020   Procedure: LEFT HEART CATH AND CORONARY ANGIOGRAPHY;  Surgeon: Wellington Hampshire, MD;  Location: [place] [facility];  Service: Cardiovascular;  Laterality: N/A;  . NECK SURGERY  2007  . UPPER GASTROINTESTINAL ENDOSCOPY  2020   Ganem-h.pylori per pt     Current Outpatient Medications  Medication Sig Dispense Refill  . aspirin EC 81 MG tablet Take 1 tablet (81 mg total) by mouth daily. Swallow whole. 90 tablet 3  . metoprolol tartrate (LOPRESSOR) 25 MG tablet Take 1 tablet (25 mg total) by mouth 2 (two) times daily. 60 tablet 2  . nitroGLYCERIN (NITROSTAT) 0.4 MG SL  tablet Place 1 tablet (0.4 mg total) under the tongue every 5 (five) minutes as needed for chest pain. 25 tablet 2  . rosuvastatin (CRESTOR) 40 MG tablet Take 1 tablet (40 mg total) by mouth daily. 30 tablet 5  . ticagrelor (BRILINTA) 90 MG TABS tablet Take 1 tablet (90 mg total) by mouth 2 (two) times daily. 60 tablet 11   No current facility-administered medications for this visit.    Allergies:   Azithromycin    Social History:  The patient  reports that he has never smoked. He has never  used smokeless tobacco. He reports previous alcohol use. He reports previous drug use.   Family History:  The patient's family history includes CAD in his father; Diabetes in his mother; Heart disease (age of onset: 48) in his mother; Hyperlipidemia in his mother; Hypertension in his mother; Stroke in his father.    ROS:  Please see the history of present illness.   Otherwise, review of systems are positive for none.   All other systems are reviewed and negative.    PHYSICAL EXAM: VS:  BP 104/60 (BP Location: Left Arm, Patient Position: Sitting, Cuff Size: Normal)   Pulse 71   Ht 5\' 8"  (1.727 m)   Wt 142 lb 6 oz (64.6 kg)   SpO2 98%   BMI 21.65 kg/m  , BMI Body mass index is 21.65 kg/m. GEN: Well nourished, well developed, in no acute distress  HEENT: normal  Neck: no JVD, carotid bruits, or masses Cardiac: RRR; no murmurs, rubs, or gallops,no edema  Respiratory:  clear to auscultation bilaterally, normal work of breathing GI: soft, nontender, nondistended, + BS MS: no deformity or atrophy  Skin: warm and dry, no rash Neuro:  Strength and sensation are intact Psych: euthymic mood, full affect Right radial pulses normal with no hematoma.  EKG:  EKG is ordered today. The ekg ordered today demonstrates normal sinus rhythm with no significant ST or T wave changes.   Recent Labs: 11/19/2020: ALT 11; BUN 11; Creatinine, Ser 1.09; Hemoglobin 11.5; Platelets 130; Potassium 3.9; Sodium 134    Lipid Panel    Component Value Date/Time   CHOL 122 11/19/2020 0236   TRIG 85 11/19/2020 0236   HDL 32 (L) 11/19/2020 0236   CHOLHDL 3.8 11/19/2020 0236   VLDL 17 11/19/2020 0236   LDLCALC 73 11/19/2020 0236      Wt Readings from Last 3 Encounters:  12/08/20 142 lb 6 oz (64.6 kg)  11/19/20 139 lb 1.6 oz (63.1 kg)  10/25/20 144 lb (65.3 kg)        PAD Screen 10/25/2020  Previous PAD dx? No  Previous surgical procedure? No  Pain with walking? No  Feet/toe relief with dangling? No   Painful, non-healing ulcers? No  Extremities discolored? Yes      ASSESSMENT AND PLAN:  1.  Coronary artery disease involving native coronary arteries with stable angina: Significant improvement in symptoms after recent angioplasty and drug-eluting stent placement to the LAD.  He continues have significant disease in the left circumflex and given continued mild anginal symptoms, recommend proceeding with staged left circumflex PCI.  I discussed the procedure in details as well as risk and benefits. He does report feeling sluggish especially in the morning could be related to treatment with metoprolol.  I elected to decrease the dose to 12.5 mg twice daily.  2.  Hyperlipidemia: Lipid profile showed an LDL of 73 but given the patient's heavily calcified vessels and significant coronary  artery disease, he was started on a statin.  I suspect that he has other metabolic abnormalities leading to his aggressive atherosclerosis.  Continue high-dose rosuvastatin.  We should consider detailed lipid testing upon follow-up.  3.  GERD: He reports that most of his symptoms has resolved since PCI and its possible that some of his GERD symptoms were related to angina.    Disposition:   Follow-up with me in 1 month.  Signed,  Kathlyn Sacramento, MD  12/08/2020 9:39 AM    [facility]

## 2020-12-08 NOTE — Telephone Encounter (Signed)
Spoke with the patient. Advise him that his pre-admit COVID test is scheduled for 12/14/20 @ 1:20pm in [place]. [address] [place], [place].

## 2020-12-08 NOTE — Patient Instructions (Addendum)
Medication Instructions:  Your physician has recommended you make the following change in your medication:   REDUCE Metoprolol to 12.5 mg (1/2 tablet) twice daily. An Rx has been sent to your pharmacy.  *If you need a refill on your cardiac medications before your next appointment, please call your pharmacy*   Lab Work: BMP and CBC today If you have labs (blood work) drawn today and your tests are completely normal, you will receive your results only by: Marland Kitchen MyChart Message (if you have MyChart) OR . A paper copy in the mail If you have any lab test that is abnormal or we need to change your treatment, we will call you to review the results.   Testing/Procedures: Your are scheduled for a PCI with Dr. Fletcher Anon. Please see the instructions below.   Follow-Up: At [facility], you and your health needs are our priority.  As part of our continuing mission to provide you with exceptional heart care, we have created designated Provider Care Teams.  These Care Teams include your primary Cardiologist (physician) and Advanced Practice Providers (APPs -  Physician Assistants and Nurse Practitioners) who all work together to provide you with the care you need, when you need it.  We recommend signing up for the patient portal called "MyChart".  Sign up information is provided on this After Visit Summary.  MyChart is used to connect with patients for Virtual Visits (Telemedicine).  Patients are able to view lab/test results, encounter notes, upcoming appointments, etc.  Non-urgent messages can be sent to your provider as well.   To learn more about what you can do with MyChart, go to [email or web address].    Your next appointment:   4 week(s)  The format for your next appointment:   In Person  Provider:   You may see Kathlyn Sacramento, MD or one of the following Advanced Practice Providers on your designated Care Team:    Murray Hodgkins, NP  Christell Faith, PA-C  Marrianne Mood,  PA-C  Cadence Kathlen Mody, [place]  Laurann Montana, NP    Other Instructions     [facility] [place], [place] [address] Dept: [phone] Loc: [place] [place]  12/08/2020  You are scheduled for a PCI on Friday, March 25 with Dr. Kathlyn Sacramento.  1. Please arrive at the [facility] (Main Entrance A) at [facility]: [address] [place], [place] [address] at 7:00 AM (This time is two hours before your procedure to ensure your preparation). Free valet parking service is available.   Special note: Every effort is made to have your procedure done on time. Please understand that emergencies sometimes delay scheduled procedures.  2. Diet: Do not eat solid foods after midnight.  The patient may have clear liquids until 5am upon the day of the procedure.  3. Labs: You will need to have blood drawn today (BMP, CBC).   You will need a COVID test on Wed 12/14/20. We will call you with the appt time and location.  4. Medication instructions in preparation for your procedure:   Contrast Allergy: No    On the morning of your procedure, take your Aspirin and any morning medicines NOT listed above.  You may use sips of water.  5. Plan for one night stay--bring personal belongings. 6. Bring a current list of your medications and current insurance cards. 7. You MUST have a responsible person to drive you home. 8.  Someone MUST be with you the first 24 hours after you arrive home or your discharge will be delayed. 9. Please wear clothes that are easy to get on and off and wear slip-on shoes.  Thank you for allowing [place] to care for you!   -- [facility]

## 2020-12-09 LAB — BASIC METABOLIC PANEL
BUN/Creatinine Ratio: 10 (ref 10–24)
BUN: 10 mg/dL (ref 8–27)
CO2: 23 mmol/L (ref 20–29)
Calcium: 9.3 mg/dL (ref 8.6–10.2)
Chloride: 98 mmol/L (ref 96–106)
Creatinine, Ser: 0.99 mg/dL (ref 0.76–1.27)
Glucose: 112 mg/dL — ABNORMAL HIGH (ref 65–99)
Potassium: 5.6 mmol/L — ABNORMAL HIGH (ref 3.5–5.2)
Sodium: 136 mmol/L (ref 134–144)
eGFR: 84 mL/min/{1.73_m2} (ref >59)

## 2020-12-09 LAB — CBC WITH DIFFERENTIAL/PLATELET
Basophils Absolute: 0 10*3/uL (ref 0.0–0.2)
Basos: 0 %
EOS (ABSOLUTE): 0 10*3/uL (ref 0.0–0.4)
Eos: 1 %
Hematocrit: 37 % — ABNORMAL LOW (ref 37.5–51.0)
Hemoglobin: 12.7 g/dL — ABNORMAL LOW (ref 13.0–17.7)
Immature Grans (Abs): 0 10*3/uL (ref 0.0–0.1)
Immature Granulocytes: 0 %
Lymphocytes Absolute: 1.1 10*3/uL (ref 0.7–3.1)
Lymphs: 21 %
MCH: 29 pg (ref 26.6–33.0)
MCHC: 34.3 g/dL (ref 31.5–35.7)
MCV: 85 fL (ref 79–97)
Monocytes Absolute: 0.5 10*3/uL (ref 0.1–0.9)
Monocytes: 9 %
Neutrophils Absolute: 3.6 10*3/uL (ref 1.4–7.0)
Neutrophils: 69 %
Platelets: 179 10*3/uL (ref 150–450)
RBC: 4.38 x10E6/uL (ref 4.14–5.80)
RDW: 11.9 % (ref 11.6–15.4)
WBC: 5.3 10*3/uL (ref 3.4–10.8)

## 2020-12-12 NOTE — Addendum Note (Signed)
Addended by: Britt Bottom on: 12/12/2020 09:07 AM   Modules accepted: Orders

## 2020-12-13 ENCOUNTER — Telehealth: Payer: Self-pay | Admitting: Cardiovascular Disease

## 2020-12-13 NOTE — Telephone Encounter (Signed)
Attempted to schedule.  LMOV to call office.  Patient needs 4 week fu Arida per avs 12-08-20

## 2020-12-14 ENCOUNTER — Other Ambulatory Visit (HOSPITAL_COMMUNITY)
Admission: RE | Admit: 2020-12-14 | Discharge: 2020-12-14 | Disposition: A | Discharge status: Home / Self Care | Payer: BC Managed Care – PPO | Source: Ambulatory Visit | Attending: Cardiovascular Disease | Admitting: Cardiovascular Disease

## 2020-12-14 DIAGNOSIS — Z881 Allergy status to other antibiotic agents status: Secondary | ICD-10-CM | POA: Diagnosis not present

## 2020-12-14 DIAGNOSIS — Z20822 Contact with and (suspected) exposure to covid-19: Secondary | ICD-10-CM | POA: Insufficient documentation

## 2020-12-14 DIAGNOSIS — Z79899 Other long term (current) drug therapy: Secondary | ICD-10-CM | POA: Diagnosis not present

## 2020-12-14 DIAGNOSIS — Z01812 Encounter for preprocedural laboratory examination: Secondary | ICD-10-CM | POA: Insufficient documentation

## 2020-12-14 DIAGNOSIS — Z955 Presence of coronary angioplasty implant and graft: Secondary | ICD-10-CM | POA: Diagnosis not present

## 2020-12-14 DIAGNOSIS — I208 Other forms of angina pectoris: Secondary | ICD-10-CM | POA: Diagnosis present

## 2020-12-14 DIAGNOSIS — I2584 Coronary atherosclerosis due to calcified coronary lesion: Secondary | ICD-10-CM | POA: Diagnosis not present

## 2020-12-14 DIAGNOSIS — Z8249 Family history of ischemic heart disease and other diseases of the circulatory system: Secondary | ICD-10-CM | POA: Diagnosis not present

## 2020-12-14 DIAGNOSIS — I7300 Raynaud's syndrome without gangrene: Secondary | ICD-10-CM | POA: Diagnosis not present

## 2020-12-14 DIAGNOSIS — Z7982 Long term (current) use of aspirin: Secondary | ICD-10-CM | POA: Diagnosis not present

## 2020-12-14 DIAGNOSIS — K219 Gastro-esophageal reflux disease without esophagitis: Secondary | ICD-10-CM | POA: Diagnosis not present

## 2020-12-14 DIAGNOSIS — I25118 Atherosclerotic heart disease of native coronary artery with other forms of angina pectoris: Secondary | ICD-10-CM | POA: Diagnosis not present

## 2020-12-14 LAB — SARS CORONAVIRUS 2 (TAT 6-24 HRS): SARS Coronavirus 2: NEGATIVE

## 2020-12-14 NOTE — Telephone Encounter (Signed)
Patient wants to wait to schedule follow up at this time Will call when ready

## 2020-12-15 ENCOUNTER — Telehealth: Payer: Self-pay | Admitting: *Deleted

## 2020-12-15 NOTE — Telephone Encounter (Signed)
Recall in place closing encounter 

## 2020-12-15 NOTE — Telephone Encounter (Addendum)
Pt contacted pre-coronary stent intervention scheduled at [facility] for: Friday December 16, 2020 9 AM Verified arrival time and place: [facility]) at: 7 AM  No solid food after midnight prior to cath, clear liquids until 5 AM day of procedure.   AM meds can be  taken pre-cath with sips of water including: ASA 81 mg Brilinta 90 mg  Confirmed patient has responsible adult to drive home post procedure and be with patient first 24 hours after arriving home: yes  You are allowed ONE visitor in the waiting room during the time you are at the hospital for your procedure. Both you and your visitor must wear a mask once you enter the hospital.    Reviewed procedure/mask/visitor instructions with patient.

## 2020-12-16 ENCOUNTER — Other Ambulatory Visit: Payer: Self-pay

## 2020-12-16 ENCOUNTER — Telehealth: Payer: Self-pay | Admitting: Cardiovascular Disease

## 2020-12-16 ENCOUNTER — Ambulatory Visit (HOSPITAL_COMMUNITY)
Admission: RE | Admit: 2020-12-16 | Discharge: 2020-12-16 | Disposition: A | Discharge status: Home / Self Care | Payer: BC Managed Care – PPO | Attending: Cardiovascular Disease | Admitting: Cardiovascular Disease

## 2020-12-16 ENCOUNTER — Encounter (HOSPITAL_COMMUNITY)
Admission: RE | Disposition: A | Discharge status: Home / Self Care | Payer: Self-pay | Source: Home / Self Care | Attending: Cardiovascular Disease

## 2020-12-16 DIAGNOSIS — I25118 Atherosclerotic heart disease of native coronary artery with other forms of angina pectoris: Secondary | ICD-10-CM | POA: Diagnosis not present

## 2020-12-16 DIAGNOSIS — I7300 Raynaud's syndrome without gangrene: Secondary | ICD-10-CM | POA: Insufficient documentation

## 2020-12-16 DIAGNOSIS — Z8249 Family history of ischemic heart disease and other diseases of the circulatory system: Secondary | ICD-10-CM | POA: Insufficient documentation

## 2020-12-16 DIAGNOSIS — I2584 Coronary atherosclerosis due to calcified coronary lesion: Secondary | ICD-10-CM | POA: Diagnosis not present

## 2020-12-16 DIAGNOSIS — Z955 Presence of coronary angioplasty implant and graft: Secondary | ICD-10-CM | POA: Insufficient documentation

## 2020-12-16 DIAGNOSIS — E785 Hyperlipidemia, unspecified: Secondary | ICD-10-CM | POA: Diagnosis present

## 2020-12-16 DIAGNOSIS — Z79899 Other long term (current) drug therapy: Secondary | ICD-10-CM | POA: Insufficient documentation

## 2020-12-16 DIAGNOSIS — I2510 Atherosclerotic heart disease of native coronary artery without angina pectoris: Secondary | ICD-10-CM | POA: Diagnosis present

## 2020-12-16 DIAGNOSIS — K219 Gastro-esophageal reflux disease without esophagitis: Secondary | ICD-10-CM | POA: Insufficient documentation

## 2020-12-16 DIAGNOSIS — Z20822 Contact with and (suspected) exposure to covid-19: Secondary | ICD-10-CM | POA: Diagnosis not present

## 2020-12-16 DIAGNOSIS — I200 Unstable angina: Secondary | ICD-10-CM | POA: Diagnosis present

## 2020-12-16 DIAGNOSIS — Z7982 Long term (current) use of aspirin: Secondary | ICD-10-CM | POA: Insufficient documentation

## 2020-12-16 DIAGNOSIS — Z881 Allergy status to other antibiotic agents status: Secondary | ICD-10-CM | POA: Insufficient documentation

## 2020-12-16 HISTORY — PX: INTRAVASCULAR IMAGING/OCT: CATH118326

## 2020-12-16 HISTORY — PX: CORONARY STENT INTERVENTION: CATH118234

## 2020-12-16 LAB — BASIC METABOLIC PANEL
Anion gap: 6 (ref 5–15)
BUN: 15 mg/dL (ref 8–23)
CO2: 27 mmol/L (ref 22–32)
Calcium: 8.7 mg/dL — ABNORMAL LOW (ref 8.9–10.3)
Chloride: 101 mmol/L (ref 98–111)
Creatinine, Ser: 1.06 mg/dL (ref 0.61–1.24)
GFR, Estimated: >60 mL/min (ref >60)
Glucose, Bld: 102 mg/dL — ABNORMAL HIGH (ref 70–99)
Potassium: 4.2 mmol/L (ref 3.5–5.1)
Sodium: 134 mmol/L — ABNORMAL LOW (ref 135–145)

## 2020-12-16 SURGERY — CORONARY STENT INTERVENTION
Anesthesia: LOCAL

## 2020-12-16 MED ORDER — SODIUM CHLORIDE 0.9 % IV SOLN: 250.0000 mL | INTRAVENOUS | Status: DC | PRN

## 2020-12-16 MED ORDER — VERAPAMIL HCL 2.5 MG/ML IV SOLN
INTRAVENOUS | Status: DC | PRN
  Administered 2020-12-16: 10 mL via INTRA_ARTERIAL

## 2020-12-16 MED ORDER — FENTANYL CITRATE (PF) 100 MCG/2ML IJ SOLN
INTRAMUSCULAR | Status: DC | PRN
  Administered 2020-12-16: 50 ug via INTRAVENOUS

## 2020-12-16 MED ORDER — ACETAMINOPHEN 325 MG PO TABS: 650.0000 mg | ORAL_TABLET | ORAL | Status: DC | PRN

## 2020-12-16 MED ORDER — TICAGRELOR 90 MG PO TABS: 90.0000 mg | ORAL_TABLET | Freq: Two times a day (BID) | ORAL | Status: DC

## 2020-12-16 MED ORDER — HEPARIN SODIUM (PORCINE) 1000 UNIT/ML IJ SOLN
INTRAMUSCULAR | Status: DC | PRN
  Administered 2020-12-16: 7000 [IU] via INTRAVENOUS

## 2020-12-16 MED ORDER — NITROGLYCERIN 1 MG/10 ML FOR IR/CATH LAB
INTRA_ARTERIAL | Status: DC | PRN
  Administered 2020-12-16: 200 ug

## 2020-12-16 MED ORDER — SODIUM CHLORIDE 0.9% FLUSH: 3.0000 mL | Freq: Two times a day (BID) | INTRAVENOUS | Status: DC

## 2020-12-16 MED ORDER — ROSUVASTATIN CALCIUM 40 MG PO TABS: 40.0000 mg | ORAL_TABLET | Freq: Every day | ORAL | Status: DC

## 2020-12-16 MED ORDER — LIDOCAINE HCL (PF) 1 % IJ SOLN
INTRAMUSCULAR | Status: AC
  Filled 2020-12-16: qty 30

## 2020-12-16 MED ORDER — SODIUM CHLORIDE 0.9 % WEIGHT BASED INFUSION
3.0000 mL/kg/h | INTRAVENOUS | Status: DC
  Administered 2020-12-16: 3 mL/kg/h via INTRAVENOUS

## 2020-12-16 MED ORDER — LIDOCAINE HCL (PF) 1 % IJ SOLN
INTRAMUSCULAR | Status: DC | PRN
  Administered 2020-12-16: 2 mL

## 2020-12-16 MED ORDER — NITROGLYCERIN 1 MG/10 ML FOR IR/CATH LAB
INTRA_ARTERIAL | Status: AC
  Filled 2020-12-16: qty 10

## 2020-12-16 MED ORDER — SODIUM CHLORIDE 0.9 % IV SOLN: INTRAVENOUS | Status: DC

## 2020-12-16 MED ORDER — IOHEXOL 350 MG/ML SOLN
INTRAVENOUS | Status: AC
  Filled 2020-12-16: qty 1

## 2020-12-16 MED ORDER — SODIUM CHLORIDE 0.9% FLUSH: 3.0000 mL | INTRAVENOUS | Status: DC | PRN

## 2020-12-16 MED ORDER — SODIUM CHLORIDE 0.9 % WEIGHT BASED INFUSION: 1.0000 mL/kg/h | INTRAVENOUS | Status: DC

## 2020-12-16 MED ORDER — ONDANSETRON HCL 4 MG/2ML IJ SOLN: 4.0000 mg | Freq: Four times a day (QID) | INTRAMUSCULAR | Status: DC | PRN

## 2020-12-16 MED ORDER — HEPARIN SODIUM (PORCINE) 1000 UNIT/ML IJ SOLN
INTRAMUSCULAR | Status: AC
  Filled 2020-12-16: qty 1

## 2020-12-16 MED ORDER — NITROGLYCERIN 0.4 MG SL SUBL: 0.4000 mg | SUBLINGUAL_TABLET | SUBLINGUAL | Status: DC | PRN

## 2020-12-16 MED ORDER — HEPARIN (PORCINE) IN NACL 1000-0.9 UT/500ML-% IV SOLN
INTRAVENOUS | Status: AC
  Filled 2020-12-16: qty 1000

## 2020-12-16 MED ORDER — VERAPAMIL HCL 2.5 MG/ML IV SOLN
INTRAVENOUS | Status: AC
  Filled 2020-12-16: qty 2

## 2020-12-16 MED ORDER — MIDAZOLAM HCL 2 MG/2ML IJ SOLN
INTRAMUSCULAR | Status: AC
  Filled 2020-12-16: qty 2

## 2020-12-16 MED ORDER — MIDAZOLAM HCL 2 MG/2ML IJ SOLN
INTRAMUSCULAR | Status: DC | PRN
  Administered 2020-12-16: 1 mg via INTRAVENOUS

## 2020-12-16 MED ORDER — ASPIRIN EC 81 MG PO TBEC: 81.0000 mg | DELAYED_RELEASE_TABLET | Freq: Every day | ORAL | Status: DC

## 2020-12-16 MED ORDER — METOPROLOL TARTRATE 12.5 MG HALF TABLET: 12.5000 mg | ORAL_TABLET | Freq: Two times a day (BID) | ORAL | Status: DC

## 2020-12-16 MED ORDER — HEPARIN (PORCINE) IN NACL 1000-0.9 UT/500ML-% IV SOLN
INTRAVENOUS | Status: DC | PRN
  Administered 2020-12-16 (×2): 500 mL

## 2020-12-16 MED ORDER — IOHEXOL 350 MG/ML SOLN
INTRAVENOUS | Status: DC | PRN
  Administered 2020-12-16: 115 mL

## 2020-12-16 MED ORDER — FENTANYL CITRATE (PF) 100 MCG/2ML IJ SOLN
INTRAMUSCULAR | Status: AC
  Filled 2020-12-16: qty 2

## 2020-12-16 MED ORDER — ASPIRIN 81 MG PO CHEW: 81.0000 mg | CHEWABLE_TABLET | ORAL | Status: DC

## 2020-12-16 SURGICAL SUPPLY — 19 items
BALLN SAPPHIRE 2.0X20 (BALLOONS) ×2
BALLN SAPPHIRE ~~LOC~~ 2.5X18 (BALLOONS) ×1 IMPLANT
BALLOON SAPPHIRE 2.0X20 (BALLOONS) IMPLANT
CATH DRAGONFLY OPSTAR (CATHETERS) ×1 IMPLANT
CATH LAUNCHER 6FR EBU3.5 (CATHETERS) ×1 IMPLANT
DEVICE RAD TR BAND REGULAR (VASCULAR PRODUCTS) ×1 IMPLANT
DEVICE TORQUE .014-.018 (MISCELLANEOUS) IMPLANT
GLIDESHEATH SLEND SS 6F .021 (SHEATH) ×1 IMPLANT
GUIDEWIRE INQWIRE 1.5J.035X260 (WIRE) IMPLANT
INQWIRE 1.5J .035X260CM (WIRE) ×2
KIT ENCORE 40 (KITS) ×1 IMPLANT
KIT ESSENTIALS PG (KITS) ×1 IMPLANT
KIT HEART LEFT (KITS) ×2 IMPLANT
PACK CARDIAC CATHETERIZATION (CUSTOM PROCEDURE TRAY) ×2 IMPLANT
STENT RESOLUTE ONYX 2.25X38 (Permanent Stent) ×1 IMPLANT
TORQUE DEVICE .014-.018 (MISCELLANEOUS) ×2
TRANSDUCER W/STOPCOCK (MISCELLANEOUS) ×2 IMPLANT
TUBING CIL FLEX 10 FLL-RA (TUBING) ×2 IMPLANT
WIRE RUNTHROUGH .014X180CM (WIRE) ×1 IMPLANT

## 2020-12-16 NOTE — Discharge Instructions (Signed)
PLEASE REMEMBER TO BRING ALL OF YOUR MEDICATIONS TO EACH OF YOUR FOLLOW-UP OFFICE VISITS.  PLEASE ATTEND ALL SCHEDULED FOLLOW-UP APPOINTMENTS.   Activity: Increase activity slowly as tolerated. You may shower, but no soaking baths (or swimming) for 1 week. No driving for 24 hours. No lifting over 5 lbs for 1 week. No sexual activity for 1 week.   You May Return to Work: in 1 week (if applicable)  Wound Care: You may wash cath site gently with soap and water. Keep cath site clean and dry. If you notice pain, swelling, bleeding or pus at your cath site, please call [phone].   Radial Site Care  This sheet gives you information about how to care for yourself after your procedure. Your health care provider may also give you more specific instructions. If you have problems or questions, contact your health care provider. What can I expect after the procedure? After the procedure, it is common to have:  Bruising and tenderness at the catheter insertion area. Follow these instructions at home: Medicines  Take over-the-counter and prescription medicines only as told by your health care provider. Insertion site care 1. Follow instructions from your health care provider about how to take care of your insertion site. Make sure you: ? Wash your hands with soap and water before you remove your bandage (dressing). If soap and water are not available, use hand sanitizer. ? May remove dressing in 24 hours. 2. Check your insertion site every day for signs of infection. Check for: ? Redness, swelling, or pain. ? Fluid or blood. ? Pus or a bad smell. ? Warmth. 3. Do no take baths, swim, or use a hot tub for 5 days. 4. You may shower 24-48 hours after the procedure. ? Remove the dressing and gently wash the site with plain soap and water. ? Pat the area dry with a clean towel. ? Do not rub the site. That could cause bleeding. 5. Do not apply powder or lotion to the site. Activity  1. For 24  hours after the procedure, or as directed by your health care provider: ? Do not flex or bend the affected arm. ? Do not push or pull heavy objects with the affected arm. ? Do not drive yourself home from the hospital or clinic. You may drive 24 hours after the procedure. ? Do not operate machinery or power tools. ? KEEP ARM ELEVATED THE REMAINDER OF THE DAY. 2. Do not push, pull or lift anything that is heavier than 10 lb for 5 days. 3. Ask your health care provider when it is okay to: ? Return to work or school. ? Resume usual physical activities or sports. ? Resume sexual activity. General instructions  If the catheter site starts to bleed, raise your arm and put firm pressure on the site. If the bleeding does not stop, get help right away. This is a medical emergency.  DRINK PLENTY OF FLUIDS FOR THE NEXT 2-3 DAYS.  No alcohol consumption for 24 hours after receiving sedation.  If you went home on the same day as your procedure, a responsible adult should be with you for the first 24 hours after you arrive home.  Keep all follow-up visits as told by your health care provider. This is important. Contact a health care provider if:  You have a fever.  You have redness, swelling, or yellow drainage around your insertion site. Get help right away if:  You have unusual pain at the radial site.  The  catheter insertion area swells very fast.  The insertion area is bleeding, and the bleeding does not stop when you hold steady pressure on the area.  Your arm or hand becomes pale, cool, tingly, or numb. These symptoms may represent a serious problem that is an emergency. Do not wait to see if the symptoms will go away. Get medical help right away. Call your local emergency services (911 in the [place] Do not drive yourself to the hospital. Summary  After the procedure, it is common to have bruising and tenderness at the site.  Follow instructions from your health care provider about  how to take care of your radial site wound. Check the wound every day for signs of infection.  This information is not intended to replace advice given to you by your health care provider. Make sure you discuss any questions you have with your health care provider. Document Revised: 10/16/2017 Document Reviewed: 10/16/2017 Elsevier Patient Education  2020 [employer].

## 2020-12-16 NOTE — Telephone Encounter (Signed)
Patient has TOC appointment with Laurann Montana on 12/23/2020 at 8:00 am per Roby Lofts.

## 2020-12-16 NOTE — Progress Notes (Signed)
CARDIAC REHAB PHASE I   Stent education completed with pt and wife. Pt educated on importance of ASA and Brilinta. Pt given heart healthy diet. Reviewed site care, restrictions, and exercise guidelines. Will refer to CRP II [place]. Pt denies further questions or concerns at this time.  [place], RN BSN 12/16/2020 3:00 PM

## 2020-12-16 NOTE — Discharge Summary (Signed)
Discharge Summary for Same Day PCI   Patient ID: Christopher [name] MRN: [id number]; DOB: [birth year]  Admit date: 12/16/2020 Discharge date: 12/16/2020  Primary Care Provider: Wenda Low, MD  Primary Cardiologist: Kathlyn Sacramento, MD  Primary Electrophysiologist:  None   Discharge Diagnoses    Principal Problem:   Unstable angina [facility]) Active Problems:   CAD (coronary artery disease)   Dyslipidemia   Raynaud disease    Diagnostic Studies/Procedures    Cardiac Catheterization 12/16/2020:  Dist LAD-1 lesion is 60% stenosed.  Dist LAD-2 lesion is 95% stenosed.  2nd Diag lesion is 40% stenosed.  Prox Cx to Mid Cx lesion is 90% stenosed.  3rd Mrg lesion is 60% stenosed.  Mid LAD-1 lesion is 30% stenosed.  Mid LAD-2 lesion is 15% stenosed.  Balloon angioplasty was performed.  Post intervention, there is a 0% residual stenosis.  A drug-eluting stent was successfully placed using a STENT RESOLUTE ONYX [id number].   Successful OCT guided angioplasty and drug-eluting stent placement to the mid left circumflex.  Recommendations: Continue dual antiplatelet therapy. Aggressive treatment of risk factors. The patient is a candidate for same-day discharge.  Diagnostic Dominance: Left    Intervention     _____________   History of Present Illness     Christopher [name] is a 65 y.o. male with a PMH of CAD s/p PCI/DES to LAD 10/2020, HLD, GERD, and raynaud's disease. He was seen outpatient by Dr. Fletcher Anon 12/08/20 following recent heart catheterization to evaluate an abnormal coronary CTA. He underwent LHC 10/2020 which showed significant 3-vessel CAD with culprit lesion felt to be 2/2 heavily calcified severe mid LAD stenosis. He also had dLAD stenosis, occluded small non-dominate RCA with left to right collaterals, and 80% LCx stenosis. He underwent PCI/DES to mLAD with residual 15% stenosis in the proximal segment of the stent despite high pressure ballooning. At the time of his  visit, he continued to have some minor chest pain episodes, though largely improved. Feeling sluggish in the morning. Given ongoing mild anginal symptoms, decision made to pursue staged PCI of the LCx.  Cardiac catheterization was arranged for further evaluation.   Hospital Course     The patient underwent cardiac cath as noted above with successful PCI/DES to LCx. Plan for DAPT with ASA/Brilinta for at least 1 year. The patient was seen by cardiac rehab while in short stay. There were no observed complications post cath. Radial cath site was re-evaluated prior to discharge and found to be stable without any complications. Instructions/precautions regarding cath site care were given prior to discharge.  Christopher [name] was seen by Dr. Fletcher Anon and determined stable for discharge home. Follow up with our office has been arranged. Medications are listed below. No medication changes occurred this visit.  _____________  Cath/PCI Registry Performance & Quality Measures: 1. Aspirin prescribed? - Yes 2. ADP Receptor Inhibitor (Plavix/Clopidogrel, Brilinta/Ticagrelor or Effient/Prasugrel) prescribed (includes medically managed patients)? - Yes 3. High Intensity Statin (Lipitor 40-80mg  or Crestor 20-40mg ) prescribed? - Yes 4. For EF <40%, was ACEI/ARB prescribed? - Not Applicable (EF >/= 41%) 5. For EF <40%, Aldosterone Antagonist (Spironolactone or Eplerenone) prescribed? - Not Applicable (EF >/= 74%) 6. Cardiac Rehab Phase II ordered (Included Medically managed Patients)? - Yes  _____________   Discharge Vitals Blood pressure 114/75, pulse 77, temperature (!) 97.4 F (36.3 C), temperature source Oral, resp. rate 16, height 5\' 8"  (1.727 m), weight 63.5 kg, SpO2 99 %.  Filed Weights   12/16/20 0723  Weight: 63.5 kg  Last Labs & Radiologic Studies    CBC No results for input(s): WBC, NEUTROABS, HGB, HCT, MCV, PLT in the last 72 hours. Basic Metabolic Panel Recent Labs    12/16/20 0930  NA  134*  K 4.2  CL 101  CO2 27  GLUCOSE 102*  BUN 15  CREATININE 1.06  CALCIUM 8.7*   Liver Function Tests No results for input(s): AST, ALT, ALKPHOS, BILITOT, PROT, ALBUMIN in the last 72 hours. No results for input(s): LIPASE, AMYLASE in the last 72 hours. High Sensitivity Troponin:   No results for input(s): TROPONINIHS in the last 720 hours.  BNP Invalid input(s): POCBNP D-Dimer No results for input(s): DDIMER in the last 72 hours. Hemoglobin A1C No results for input(s): HGBA1C in the last 72 hours. Fasting Lipid Panel No results for input(s): CHOL, HDL, LDLCALC, TRIG, CHOLHDL, LDLDIRECT in the last 72 hours. Thyroid Function Tests No results for input(s): TSH, T4TOTAL, T3FREE, THYROIDAB in the last 72 hours.  Invalid input(s): FREET3 _____________  CARDIAC CATHETERIZATION  Result Date: 12/16/2020  Dist LAD-1 lesion is 60% stenosed.  Dist LAD-2 lesion is 95% stenosed.  2nd Diag lesion is 40% stenosed.  Prox Cx to Mid Cx lesion is 90% stenosed.  3rd Mrg lesion is 60% stenosed.  Mid LAD-1 lesion is 30% stenosed.  Mid LAD-2 lesion is 15% stenosed.  Balloon angioplasty was performed.  Post intervention, there is a 0% residual stenosis.  A drug-eluting stent was successfully placed using a STENT RESOLUTE ONYX [id number].  Successful OCT guided angioplasty and drug-eluting stent placement to the mid left circumflex. Recommendations: Continue dual antiplatelet therapy. Aggressive treatment of risk factors. The patient is a candidate for same-day discharge.   CARDIAC CATHETERIZATION  Addendum Date: 11/18/2020    Prox Cx to Mid Cx lesion is 80% stenosed.  3rd Mrg lesion is 60% stenosed.  Mid LAD lesion is 95% stenosed.  Post intervention, there is a 15% residual stenosis.  A drug-eluting stent was successfully placed using a STENT RESOLUTE ONYX 2.75X30.  Dist LAD-1 lesion is 60% stenosed.  Dist LAD-2 lesion is 95% stenosed.  2nd Diag lesion is 40% stenosed.  Prox RCA lesion  is 95% stenosed.  Mid RCA lesion is 80% stenosed.  1.  Left dominant coronary arteries with significant three-vessel coronary artery disease.  The culprit seems to be heavily calcified mid LAD disease.  The distal LAD also has significant stenosis.  The RCA is small and nondominant and has left-to-right collaterals. 2.  Normal LV systolic function and left ventricular end-diastolic pressure. 3.  Successful OCT guided angioplasty and drug-eluting stent placement to the mid LAD.  The procedure was very difficult due to calcifications.  There was 15% residual stenosis in the proximal segment of the stent in spite of high pressure noncompliant balloon inflation.  2 small diagonals were jailed by the stent with sluggish flow.  Not large enough to rescue and the patient was placed on a nitroglycerin drip. Recommendations: CABG was considered but the distal LAD is not a good target.  In addition, the RCA is small and nondominant. We will admit for observation on a nitroglycerin drip. Recommend dual antiplatelet therapy for at least 6 months. The patient likely will require staged left circumflex PCI as an outpatient.   Result Date: 11/18/2020  Prox Cx to Mid Cx lesion is 80% stenosed.  3rd Mrg lesion is 60% stenosed.  Mid LAD lesion is 95% stenosed.  Post intervention, there is a 15% residual stenosis.  A drug-eluting  stent was successfully placed using a STENT RESOLUTE ONYX 2.75X30.  Dist LAD-1 lesion is 60% stenosed.  Dist LAD-2 lesion is 95% stenosed.  2nd Diag lesion is 40% stenosed.  Prox RCA lesion is 95% stenosed.  Mid RCA lesion is 80% stenosed.  1.  Left dominant coronary arteries with significant three-vessel coronary artery disease.  The culprit seems to be heavily calcified mid LAD disease.  The distal LAD also has significant stenosis.  The RCA is small and nondominant and has left-to-right collaterals. 2.  Normal LV systolic function and left ventricular end-diastolic pressure. 3.  Successful OCT  guided angioplasty and drug-eluting stent placement to the mid LAD.  The procedure was very difficult due to calcifications.  There was 15% residual stenosis in the proximal segment of the stent in spite of high pressure noncompliant balloon inflation.  2 small diagonals were jailed by the stent with sluggish flow.  Not large enough to rescue and the patient was placed on a nitroglycerin drip. Recommendations: We will admit for observation on a nitroglycerin drip. Recommend dual antiplatelet therapy for at least 6 months. The patient likely will require staged left circumflex PCI as an outpatient.    Disposition   Pt is being discharged home today in good condition.  Follow-up Plans & Appointments     Follow-up Information    Loel Dubonnet, NP Follow up on 12/23/2020.   Specialty: Cardiology Why: Please arrive 15 minutes early for your 8am post-stent follow-up appointment Contact information: [place] [address] [phone]              Discharge Instructions    Amb Referral to Cardiac Rehabilitation   Complete by: As directed    Diagnosis: Coronary Stents   After initial evaluation and assessments completed: Virtual Based Care may be provided alone or in conjunction with Phase 2 Cardiac Rehab based on patient barriers.: Yes       Discharge Medications   Allergies as of 12/16/2020      Reactions   Azithromycin Other (See Comments)   Stomach cramps       Medication List    TAKE these medications   aspirin EC 81 MG tablet Take 1 tablet (81 mg total) by mouth daily. Swallow whole.   metoprolol tartrate 25 MG tablet Commonly known as: LOPRESSOR Take 0.5 tablets (12.5 mg total) by mouth 2 (two) times daily.   nitroGLYCERIN 0.4 MG SL tablet Commonly known as: Nitrostat Place 1 tablet (0.4 mg total) under the tongue every 5 (five) minutes as needed for chest pain.   rosuvastatin 40 MG tablet Commonly known as: Crestor Take 1 tablet  (40 mg total) by mouth daily.   ticagrelor 90 MG Tabs tablet Commonly known as: BRILINTA Take 1 tablet (90 mg total) by mouth 2 (two) times daily.   Vitamin D3 50 MCG (2000 UT) Tabs Take 2,000 Units by mouth.          Allergies Allergies  Allergen Reactions  . Azithromycin Other (See Comments)    Stomach cramps     Outstanding Labs/Studies   None  Duration of Discharge Encounter   Greater than 30 minutes including physician time.  Signed, Abigail Butts, PA-C 12/16/2020, 3:21 PM

## 2020-12-16 NOTE — Telephone Encounter (Signed)
The patient is currently admitted. Will need to reassess for a TCM call on Monday 12/19/20.

## 2020-12-16 NOTE — Progress Notes (Signed)
Pt has been ambulating to the BR without difficulty or bleeding.  Pt has meds and has had cardiac rehab teaching.  Discharge instructions have been given to pt and wife.  Both verbalize understanding and deny further questions.  Pt has been evaluated by PA and is cleared for discharge at 1940 barring any complications.

## 2020-12-16 NOTE — Interval H&P Note (Signed)
History and Physical Interval Note:  12/16/2020 12:34 PM  [facility]  has presented today for surgery, with the diagnosis of stable angina.  The various methods of treatment have been discussed with the patient and family. After consideration of risks, benefits and other options for treatment, the patient has consented to  Procedure(s): CORONARY STENT INTERVENTION (N/A) as a surgical intervention.  The patient's history has been reviewed, patient examined, no change in status, stable for surgery.  I have reviewed the patient's chart and labs.  Questions were answered to the patient's satisfaction.     Kathlyn Sacramento

## 2020-12-19 ENCOUNTER — Encounter (HOSPITAL_COMMUNITY): Payer: Self-pay | Admitting: Cardiovascular Disease

## 2020-12-19 LAB — POCT ACTIVATED CLOTTING TIME
Activated Clotting Time: 547 seconds
Activated Clotting Time: 750 seconds

## 2020-12-19 NOTE — Telephone Encounter (Signed)
Patient was not admitted does not qualify for a TOC.

## 2020-12-23 ENCOUNTER — Ambulatory Visit: Payer: BC Managed Care – PPO | Admitting: Family

## 2020-12-27 ENCOUNTER — Other Ambulatory Visit: Payer: Self-pay

## 2020-12-27 ENCOUNTER — Encounter: Payer: Self-pay | Admitting: Internal Medicine

## 2020-12-27 ENCOUNTER — Ambulatory Visit (INDEPENDENT_AMBULATORY_CARE_PROVIDER_SITE_OTHER): Payer: BC Managed Care – PPO | Admitting: Internal Medicine

## 2020-12-27 VITALS — BP 120/70 | HR 68 | Ht 68.0 in | Wt 140.8 lb

## 2020-12-27 DIAGNOSIS — R141 Gas pain: Secondary | ICD-10-CM

## 2020-12-27 MED ORDER — RIFAXIMIN 550 MG PO TABS: 550.0000 mg | ORAL_TABLET | Freq: Three times a day (TID) | ORAL | 0 refills | Status: DC

## 2020-12-27 NOTE — Patient Instructions (Signed)
We have sent the following medications to [place]:  Doreene Nest

## 2020-12-27 NOTE — Progress Notes (Signed)
HISTORY OF PRESENT ILLNESS:  Christopher [name] is a 65 y.o. male, native of [place] and longstanding chemistry professor at [employer], who presents today for evaluation of chronic complaints as will be described.  I saw the patient on 1 occasion September 08, 2020 when he was sent for screening colonoscopy.  Previous GI history at [facility] gastroenterology (no records available) for which she is undergone endoscopic procedures as well as evaluation for complaints to be described.  His most recent colonoscopy revealed a diminutive colon polyp which was found to be sessile serrated polyp.  The exam was otherwise normal.  Follow-up in 5 years recommended.  The complaint that he presents with today is a constellation of symptoms that have been going on for greater than 20 years.  He describes a sensation of gas in the mid abdomen.  Subsequently results in left chest pressure followed by discomfort in his arms as well as discomfort in his legs bilaterally subsequent headache.  The symptoms can last 3 to 5 hours.  He describes extensive evaluations multiple physicians without a diagnosis with resolution.  Despite once daily PPI, he continues to have symptoms.  He occasionally takes PPI twice daily.  He feels that he may be doing better on PPI that off PPI.  He was recently hospitalized for unstable angina and underwent coronary artery stent placement December 16, 2020.  He is now on aspirin Brilinta.  Despite his cardiac intervention he describes 3 additional episodes of his chronic syndrome.  He states that he also notices symptoms can occur when it is cold outside.  He describes placing a warm scarf on his abdomen in the winter.  Spicy foods may exacerbate or bring on symptoms.  He states that he feels gassy.  He does tell me that he was successfully treated for Helicobacter pylori.  He has completed his COVID vaccination series.  Review of blood work from March 2022 shows hemoglobin 12.7  REVIEW OF SYSTEMS:  All  non-GI ROS negative unless otherwise stated in the HPI except for cough, fatigue, headaches, muscle cramps, heart rhythm change, skin rash, urinary frequency  Past Medical History:  Diagnosis Date  . Anxiety   . Cervical disc disease   . Coronary artery disease   . GERD (gastroesophageal reflux disease)    triggers by eating spicy food per pt  . Raynaud's disease   . Vitamin B 12 deficiency   . Vitamin D deficiency     Past Surgical History:  Procedure Laterality Date  . COLONOSCOPY  2011   Johnson-normal exam per pt  . CORONARY STENT INTERVENTION  11/18/2020  . CORONARY STENT INTERVENTION N/A 11/18/2020   Procedure: CORONARY STENT INTERVENTION;  Surgeon: Wellington Hampshire, MD;  Location: [place] [facility];  Service: Cardiovascular;  Laterality: N/A;  . CORONARY STENT INTERVENTION N/A 12/16/2020   Procedure: CORONARY STENT INTERVENTION;  Surgeon: Wellington Hampshire, MD;  Location: [place] [facility];  Service: Cardiovascular;  Laterality: N/A;  . infertility surgery    . INTRAVASCULAR IMAGING/OCT N/A 11/18/2020   Procedure: INTRAVASCULAR IMAGING/OCT;  Surgeon: Wellington Hampshire, MD;  Location: [place] [facility];  Service: Cardiovascular;  Laterality: N/A;  . INTRAVASCULAR IMAGING/OCT N/A 12/16/2020   Procedure: INTRAVASCULAR IMAGING/OCT;  Surgeon: Wellington Hampshire, MD;  Location: [place] [facility];  Service: Cardiovascular;  Laterality: N/A;  . LEFT HEART CATH AND CORONARY ANGIOGRAPHY N/A 11/18/2020   Procedure: LEFT HEART CATH AND CORONARY ANGIOGRAPHY;  Surgeon: Wellington Hampshire,  MD;  Location: [place] [facility];  Service: Cardiovascular;  Laterality: N/A;  . NECK SURGERY  2007  . UPPER GASTROINTESTINAL ENDOSCOPY  2020   Ganem-h.pylori per pt    Social History KOAH CHISENHALL  reports that he has never smoked. He has never used smokeless tobacco. He reports previous alcohol use. He reports previous drug use.  family history includes CAD in his father; Diabetes in his mother;  Heart disease (age of onset: 92) in his mother; Hyperlipidemia in his mother; Hypertension in his mother; Stroke in his father.  Allergies  Allergen Reactions  . Azithromycin Other (See Comments)    Stomach cramps        PHYSICAL EXAMINATION: Vital signs: BP 120/70   Pulse 68   Ht 5\' 8"  (1.727 m)   Wt 140 lb 12.8 oz (63.9 kg)   BMI 21.41 kg/m   Constitutional: generally well-appearing, no acute distress Psychiatric: alert and oriented x3, cooperative Eyes: extraocular movements intact, anicteric, conjunctiva pink Mouth: oral pharynx moist, no lesions Neck: supple no lymphadenopathy Cardiovascular: heart regular rate and rhythm, no murmur Lungs: clear to auscultation bilaterally Abdomen: soft, nontender, nondistended, no obvious ascites, no peritoneal signs, normal bowel sounds, no organomegaly Rectal: Omitted Extremities: no clubbing, cyanosis, or lower extremity edema bilaterally Skin: no lesions on visible extremities Neuro: No focal deficits. No asterixis.    ASSESSMENT:  1.  Greater than 20-year history of sensation of abdominal gas followed by chest, bilateral arm, bilateral leg, and hip pain.  Etiology unclear.  This is not reminiscent of any primary GI disorder.  Apparently has had extensive work-up elsewhere which has been unrevealing 2.  Increase intestinal gas. 3.  Recent colonoscopy with sessile serrated polyp 4.  Coronary artery disease.  Recent coronary artery stent placement.  Now on aspirin and Brilinta  PLAN:  1.  Empiric trial of Xifaxan 550 mg 3 times daily for 2 weeks for possible bacterial overgrowth 2.  Obtain outside GI records for review 3.  Surveillance colonoscopy around December 2026 4.  GI follow-up as needed

## 2020-12-28 ENCOUNTER — Telehealth (HOSPITAL_COMMUNITY): Payer: Self-pay | Admitting: *Deleted

## 2020-12-28 NOTE — Telephone Encounter (Signed)
Pt completed staged procedure on 3/25.  Pt scheduled for cardiology follow up on 4/1.  Pt did not complete this appt.  Appt was cancelled by patient.  The appt time was too early.  This appt was rescheduled for 4/7.  This appt was also cancelled by pt.  Reason provided by patient - "hasnt started rehab like discussed, will call back when ready)" Unable to move forward with scheduling until he has completed his cardiology follow up. Called to inform him that he will need to satisfactorily complete his follow up appt with cardiology before he can be scheduled.  Left message for pt to please contact cardiac rehab. Cherre Huger, BSN Cardiac and Training and development officer

## 2020-12-28 NOTE — Progress Notes (Deleted)
Cardiology Office Note    Date:  12/28/2020   ID:  Christopher [name], DOB 12/27/1955, MRN [id number]  PCP:  Wenda Low, MD  Cardiologist:  Kathlyn Sacramento, MD  Electrophysiologist:  None   Chief Complaint: Hospital follow up  History of Present Illness:   Christopher [name] is a 65 y.o. male with history of CAD status post PCI as outlined below, HLD, Raynoud's disease, family history of premature CAD, and GERD who presents for post-cath follow-up.  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 Prior lower extremity arterial Doppler in 2018 was normal. More recently, he was seen in 10/2020 with exertional chest discomfort that radiated to his epigastric region and neck.  In this setting, he underwent coronary CTA in 10/2020 which showed a calcium score of 767 with evidence of severe stenosis involving the LAD and moderate stenosis in the D1, ramus intermedius, LCx, and OM.  Given these findings, he underwent diagnostic LHC on 11/18/2020 which showed a left dominant system with significant three-vessel CAD.  The culprit vessel was felt to be heavily calcified severe mid LAD stenosis.  In addition, there was significant stenosis in the distal LAD.  The RCA was a small, nondominant vessel though noted to be occluded with left-to-right collaterals.  The LCx had 80% stenosis.  He underwent successful PCI/DES to the mid LAD.  Overall, the procedure was difficult secondary to significant calcifications.  There was 15% residual stenosis in the proximal segment of this stent despite high-pressure balloon.  2 small diagonals were jailed by the stent with sluggish flow.  When he was seen in follow-up on 12/08/2020 he noted significant improvement in his symptoms though did continue to feel sluggish in the mornings.  Most of his angina resolved though he had had some minor episodes.  With noted fatigue metoprolol was decreased to 12.5 mg twice daily.  Given ongoing anginal complaints staged PCI of the LCx was  recommended which was performed at [facility] on 12/16/2020 as outlined below.  ***   Labs independently reviewed: 11/2020 - potassium 4.2, BUN 15, SCr 1.06, HGB 12.7, PLT 179 10/2020 - TC 122, TG 85, HDL 32, LDL 73, albumin 3.3, AST/ALT normal  Past Medical History:  Diagnosis Date  . Anxiety   . Cervical disc disease   . Coronary artery disease   . GERD (gastroesophageal reflux disease)    triggers by eating spicy food per pt  . Raynaud's disease   . Vitamin B 12 deficiency   . Vitamin D deficiency     Past Surgical History:  Procedure Laterality Date  . COLONOSCOPY  2011   Johnson-normal exam per pt  . CORONARY STENT INTERVENTION  11/18/2020  . CORONARY STENT INTERVENTION N/A 11/18/2020   Procedure: CORONARY STENT INTERVENTION;  Surgeon: Wellington Hampshire, MD;  Location: [place] [facility];  Service: Cardiovascular;  Laterality: N/A;  . CORONARY STENT INTERVENTION N/A 12/16/2020   Procedure: CORONARY STENT INTERVENTION;  Surgeon: Wellington Hampshire, MD;  Location: [place] [facility];  Service: Cardiovascular;  Laterality: N/A;  . infertility surgery    . INTRAVASCULAR IMAGING/OCT N/A 11/18/2020   Procedure: INTRAVASCULAR IMAGING/OCT;  Surgeon: Wellington Hampshire, MD;  Location: [place] [facility];  Service: Cardiovascular;  Laterality: N/A;  . INTRAVASCULAR IMAGING/OCT N/A 12/16/2020   Procedure: INTRAVASCULAR IMAGING/OCT;  Surgeon: Wellington Hampshire, MD;  Location: [place] [facility];  Service: Cardiovascular;  Laterality: N/A;  . LEFT HEART CATH AND CORONARY ANGIOGRAPHY N/A  11/18/2020   Procedure: LEFT HEART CATH AND CORONARY ANGIOGRAPHY;  Surgeon: Wellington Hampshire, MD;  Location: [place] [facility];  Service: Cardiovascular;  Laterality: N/A;  . NECK SURGERY  2007  . UPPER GASTROINTESTINAL ENDOSCOPY  2020   Ganem-h.pylori per pt    Current Medications: No outpatient medications have been marked as taking for the 12/29/20 encounter (Appointment) with Rise Mu, PA-C.     Allergies:   Azithromycin   Social History   Socioeconomic History  . Marital status: Married    Spouse name: Not on file  . Number of children: Not on file  . Years of education: Not on file  . Highest education level: Not on file  Occupational History  . Not on file  Tobacco Use  . Smoking status: Never Smoker  . Smokeless tobacco: Never Used  Vaping Use  . Vaping Use: Never used  Substance and Sexual Activity  . Alcohol use: Not Currently  . Drug use: Not Currently  . Sexual activity: Not on file  Other Topics Concern  . Not on file  Social History Narrative  . Not on file   Social Determinants of Health   Financial Resource Strain: Not on file  Food Insecurity: Not on file  Transportation Needs: Not on file  Physical Activity: Not on file  Stress: Not on file  Social Connections: Not on file     Family History:  The patient's family history includes CAD in his father; Diabetes in his mother; Heart disease (age of onset: 65) in his mother; Hyperlipidemia in his mother; Hypertension in his mother; Stroke in his father. There is no history of Colon cancer, Colon polyps, Esophageal cancer, Rectal cancer, or Stomach cancer.  ROS:   ROS   EKGs/Labs/Other Studies Reviewed:    Studies reviewed were summarized above. The additional studies were reviewed today:  Coronary CTA 11/15/2020: IMPRESSION: 1. Coronary calcium score of 767. This was 54 percentile for age and sex matched control. 2. Normal coronary origin with left dominance. 3. Severe CAD with severe stenoses in the LAD and moderate stenoses in D1, ramus, Lcx and OM. 4. Recommend cardiac catheterization. __________  LHC 11/18/2020:  Prox Cx to Mid Cx lesion is 80% stenosed.  3rd Mrg lesion is 60% stenosed.  Mid LAD lesion is 95% stenosed.  Post intervention, there is a 15% residual stenosis.  A drug-eluting stent was successfully placed using a STENT RESOLUTE ONYX [id number].  Dist LAD-1 lesion  is 60% stenosed.  Dist LAD-2 lesion is 95% stenosed.  2nd Diag lesion is 40% stenosed.  Prox RCA lesion is 95% stenosed.  Mid RCA lesion is 80% stenosed.   1.  Left dominant coronary arteries with significant three-vessel coronary artery disease.  The culprit seems to be heavily calcified mid LAD disease.  The distal LAD also has significant stenosis.  The RCA is small and nondominant and has left-to-right collaterals. 2.  Normal LV systolic function and left ventricular end-diastolic pressure. 3.  Successful OCT guided angioplasty and drug-eluting stent placement to the mid LAD.  The procedure was very difficult due to calcifications.  There was 15% residual stenosis in the proximal segment of the stent in spite of high pressure noncompliant balloon inflation.  2 small diagonals were jailed by the stent with sluggish flow.  Not large enough to rescue and the patient was placed on a nitroglycerin drip.  Recommendations: CABG was considered but the distal LAD is not a good target.  In addition, the  RCA is small and nondominant. We will admit for observation on a nitroglycerin drip. Recommend dual antiplatelet therapy for at least 6 months. The patient likely will require staged left circumflex PCI as an outpatient. __________  [facility] 12/16/2020:   Dist LAD-1 lesion is 60% stenosed.  Dist LAD-2 lesion is 95% stenosed.  2nd Diag lesion is 40% stenosed.  Prox Cx to Mid Cx lesion is 90% stenosed.  3rd Mrg lesion is 60% stenosed.  Mid LAD-1 lesion is 30% stenosed.  Mid LAD-2 lesion is 15% stenosed.  Balloon angioplasty was performed.  Post intervention, there is a 0% residual stenosis.  A drug-eluting stent was successfully placed using a STENT RESOLUTE ONYX [id number].   Successful OCT guided angioplasty and drug-eluting stent placement to the mid left circumflex.  Recommendations: Continue dual antiplatelet therapy. Aggressive treatment of risk factors. The patient is a  candidate for same-day discharge.   EKG:  EKG is ordered today.  The EKG ordered today demonstrates ***  Recent Labs: 11/19/2020: ALT 11 12/08/2020: Hemoglobin 12.7; Platelets 179 12/16/2020: BUN 15; Creatinine, Ser 1.06; Potassium 4.2; Sodium 134  Recent Lipid Panel    Component Value Date/Time   CHOL 122 11/19/2020 0236   TRIG 85 11/19/2020 0236   HDL 32 (L) 11/19/2020 0236   CHOLHDL 3.8 11/19/2020 0236   VLDL 17 11/19/2020 0236   LDLCALC 73 11/19/2020 0236    PHYSICAL EXAM:    VS:  There were no vitals taken for this visit.  BMI: There is no height or weight on file to calculate BMI.  Physical Exam  Wt Readings from Last 3 Encounters:  12/27/20 140 lb 12.8 oz (63.9 kg)  12/16/20 140 lb (63.5 kg)  12/08/20 142 lb 6 oz (64.6 kg)     ASSESSMENT & PLAN:   1. ***  Disposition: F/u with Dr. Fletcher Anon or an APP in ***.   Medication Adjustments/Labs and Tests Ordered: Current medicines are reviewed at length with the patient today.  Concerns regarding medicines are outlined above. Medication changes, Labs and Tests ordered today are summarized above and listed in the Patient Instructions accessible in Encounters.   Signed, Christell Faith, PA-C 12/28/2020 7:46 AM     [address] [place], [place] [address] [phone]

## 2020-12-29 ENCOUNTER — Ambulatory Visit: Payer: BC Managed Care – PPO | Admitting: Physician Assistant

## 2021-01-23 ENCOUNTER — Telehealth (HOSPITAL_COMMUNITY): Payer: Self-pay

## 2021-01-23 NOTE — Telephone Encounter (Signed)
Pt called and stated that he wanted to schedule cardiac rehab, I advised pt that he would have to do a follow up with his cardiologist because he had a 2nd event to happen pt stated that he spoke with his cardiologist on the phone and he stated that he could proceed with cardiac rehab and didn't have to come in to see him. Per Carlette (nurse navigator) we needed something in writing in pt chart explaining that pt can participate in cardiac rehab without a follow up. Pt stated that he would call his cardiologist office and tell them this information and see if they can place that note. Advised pt if they need to call us they could. Also advised pt where have a backlog and he wouldn't be able to get in until like a month or 2 depending on how many people sch. Advised pt that he has about 50-60 people ahead of him to schedule. Pt understood.

## 2021-01-24 NOTE — Telephone Encounter (Addendum)
To Dr. Fletcher Anon: Dr. Fletcher Anon,  Please document in this encounter if it is ok for the patient to participate in cardiac rehab prior to following up in our office.    ----- Message from Wellington Hampshire, MD sent at 01/24/2021 10:24 AM EDT ----- Regarding: Cardiac rehab  Can you please place another referral to cardiac rehab at [facility]? Thanks

## 2021-01-26 ENCOUNTER — Telehealth: Payer: Self-pay

## 2021-01-26 NOTE — Telephone Encounter (Signed)
Noted. Will fwd to [facility] cardiac rehab.

## 2021-01-26 NOTE — Telephone Encounter (Signed)
Yes the patient can participate in cardiac rehab at [facility] prior to his follow-up appointment.  I have been in contact with him and have been following up with his progress.  Thanks.

## 2021-01-26 NOTE — Telephone Encounter (Signed)
Records received from [facility] and place on Dr. Blanch Media desk for his review.

## 2021-03-02 ENCOUNTER — Telehealth: Payer: Self-pay

## 2021-03-02 DIAGNOSIS — Z006 Encounter for examination for normal comparison and control in clinical research program: Secondary | ICD-10-CM

## 2021-03-02 NOTE — Telephone Encounter (Signed)
I called patient for his 90-day Identify Study follow up phone call.  Pt stated he was having chest pain at rest and with exertion after his CTA. He followed up with his Cardiologist and had 2 cardiac catheterizations with Interventions on 11/18/2020 and 12/16/2020. Pt stated his symptoms have resolved since then. Pt is doing well with no cardiac symptoms at this time. I reminded patient I would call him in February for his 1 year follow-up.

## 2021-03-08 ENCOUNTER — Telehealth (HOSPITAL_COMMUNITY): Payer: Self-pay

## 2021-03-20 NOTE — Telephone Encounter (Signed)
Cardiac Rehab - Pharmacy Resident Documentation   Patient unable to be reached after three call attempts. Please complete allergy verification and medication review during patient's cardiac rehab appointment.    Shauna Hugh, PharmD, [place]  PGY-1 Pharmacy Resident 03/20/2021 4:45 PM

## 2021-03-23 ENCOUNTER — Encounter (HOSPITAL_COMMUNITY)
Admission: RE | Admit: 2021-03-23 | Discharge: 2021-03-23 | Disposition: A | Discharge status: Home / Self Care | Payer: BC Managed Care – PPO | Source: Ambulatory Visit | Attending: Cardiovascular Disease | Admitting: Cardiovascular Disease

## 2021-03-23 ENCOUNTER — Other Ambulatory Visit: Payer: Self-pay

## 2021-03-23 VITALS — BP 102/60 | HR 70 | Ht 66.5 in | Wt 136.7 lb

## 2021-03-23 DIAGNOSIS — Z955 Presence of coronary angioplasty implant and graft: Secondary | ICD-10-CM | POA: Insufficient documentation

## 2021-03-24 ENCOUNTER — Encounter (HOSPITAL_COMMUNITY): Payer: Self-pay

## 2021-03-24 NOTE — Progress Notes (Signed)
Cardiac Rehab Medication Review by a Nurse  Does the patient  feel that his/her medications are working for him/her?  YES   Has the patient been experiencing any side effects to the medications prescribed?   NO  Does the patient measure his/her own blood pressure or blood glucose at home?  YES   Does the patient have any problems obtaining medications due to transportation or finances?    NO  Understanding of regimen:  Understanding of indications: excellentexcellent Potential of compliance: excellent    Nurse comments: Christopher [name] has a good understanding of his medications and taking as prescribed. Christopher [name] monitors his blood pressures at home with a home  BP monitor.    Christopher Gave RN BSN 03/24/2021 8:29 AM

## 2021-03-24 NOTE — Progress Notes (Signed)
Cardiac Individual Treatment Plan  Patient Details  Name: Christopher [name] MRN: [id number] Date of Birth: [birth year] Referring Provider:   Flowsheet Row CARDIAC REHAB PHASE II ORIENTATION from 03/23/2021 in [place]  Referring Provider Kathlyn Sacramento, MD       Initial Encounter Date:  [place] PHASE II ORIENTATION from 03/23/2021 in [place]  Date 03/23/21       Visit Diagnosis: 11/18/20 DES MID LAD, 12/16/20 DES LCFX  Patient's Home Medications on Admission:  Current Outpatient Medications:    aspirin EC 81 MG tablet, Take 1 tablet (81 mg total) by mouth daily. Swallow whole., Disp: 90 tablet, Rfl: 3   Cholecalciferol (VITAMIN D3) 50 MCG (2000 UT) TABS, Take 2,000 Units by mouth., Disp: , Rfl:    metoprolol tartrate (LOPRESSOR) 25 MG tablet, Take 0.5 tablets (12.5 mg total) by mouth 2 (two) times daily., Disp: 45 tablet, Rfl: 1   nitroGLYCERIN (NITROSTAT) 0.4 MG SL tablet, Place 1 tablet (0.4 mg total) under the tongue every 5 (five) minutes as needed for chest pain., Disp: 25 tablet, Rfl: 2   pantoprazole (PROTONIX) 40 MG tablet, Take 40 mg by mouth daily., Disp: , Rfl:    rosuvastatin (CRESTOR) 40 MG tablet, Take 1 tablet (40 mg total) by mouth daily., Disp: 30 tablet, Rfl: 5   ticagrelor (BRILINTA) 90 MG TABS tablet, Take 1 tablet (90 mg total) by mouth 2 (two) times daily., Disp: 60 tablet, Rfl: 11   rifaximin (XIFAXAN) 550 MG TABS tablet, Take 1 tablet (550 mg total) by mouth 3 (three) times daily. (Patient not taking: Reported on 03/24/2021), Disp: 42 tablet, Rfl: 0  Past Medical History: Past Medical History:  Diagnosis Date   Anxiety    Cervical disc disease    Coronary artery disease    GERD (gastroesophageal reflux disease)    triggers by eating spicy food per pt   Raynaud's disease    Vitamin B 12 deficiency    Vitamin D deficiency     Tobacco Use: Social History   Tobacco Use   Smoking Status Never  Smokeless Tobacco Never    Labs: Recent Review Flowsheet Data     Labs for ITP Cardiac and Pulmonary Rehab Latest Ref Rng & Units 11/19/2020   Cholestrol 0 - 200 mg/dL 122   LDLCALC 0 - 99 mg/dL 73   HDL >40 mg/dL 32(L)   Trlycerides <150 mg/dL 85       Capillary Blood Glucose: No results found for: GLUCAP   Exercise Target Goals: Exercise Program Goal: Individual exercise prescription set using results from initial 6 min walk test and THRR while considering  patient's activity barriers and safety.   Exercise Prescription Goal: Starting with aerobic activity 30 plus minutes a day, 3 days per week for initial exercise prescription. Provide home exercise prescription and guidelines that participant acknowledges understanding prior to discharge.  Activity Barriers & Risk Stratification:  Activity Barriers & Cardiac Risk Stratification - 03/23/21 1539       Activity Barriers & Cardiac Risk Stratification   Activity Barriers Back Problems    Cardiac Risk Stratification High             6 Minute Walk:  6 Minute Walk     Row Name 03/23/21 1459         6 Minute Walk   Phase Initial     Distance 1742 feet     Walk Time 6  minutes     # of Rest Breaks 0     MPH 3.3     METS 3.96     RPE 9     Perceived Dyspnea  0     VO2 Peak 13.86     Symptoms No     Resting HR 66 bpm     Resting BP 102/60     Resting Oxygen Saturation  97 %     Exercise Oxygen Saturation  during 6 min walk 99 %     Max Ex. HR 92 bpm     Max Ex. BP 114/70     2 Minute Post BP 100/68              Oxygen Initial Assessment:   Oxygen Re-Evaluation:   Oxygen Discharge (Final Oxygen Re-Evaluation):   Initial Exercise Prescription:  Initial Exercise Prescription - 03/23/21 1500       Date of Initial Exercise RX and Referring Provider   Date 03/23/21    Referring Provider Kathlyn Sacramento, MD    Expected Discharge Date 05/19/21      Treadmill   MPH 2.8     Grade 1    Minutes 15    METs 3.53      Bike   Level 2.5    Minutes 15    METs 3.5      Prescription Details   Frequency (times per week) 3    Duration Progress to 30 minutes of continuous aerobic without signs/symptoms of physical distress      Intensity   THRR 40-80% of Max Heartrate 61-122    Ratings of Perceived Exertion 11-13    Perceived Dyspnea 0-4      Progression   Progression Continue progressive overload as per policy without signs/symptoms or physical distress.      Resistance Training   Training Prescription Yes    Weight 3 lbs    Reps 10-15             Perform Capillary Blood Glucose checks as needed.  Exercise Prescription Changes:   Exercise Comments:   Exercise Goals and Review:   Exercise Goals     Row Name 03/23/21 1540             Exercise Goals   Increase Physical Activity Yes       Intervention Provide advice, education, support and counseling about physical activity/exercise needs.;Develop an individualized exercise prescription for aerobic and resistive training based on initial evaluation findings, risk stratification, comorbidities and participant's personal goals.       Expected Outcomes Short Term: Attend rehab on a regular basis to increase amount of physical activity.;Long Term: Add in home exercise to make exercise part of routine and to increase amount of physical activity.;Long Term: Exercising regularly at least 3-5 days a week.       Increase Strength and Stamina Yes       Intervention Provide advice, education, support and counseling about physical activity/exercise needs.;Develop an individualized exercise prescription for aerobic and resistive training based on initial evaluation findings, risk stratification, comorbidities and participant's personal goals.       Expected Outcomes Short Term: Increase workloads from initial exercise prescription for resistance, speed, and METs.;Short Term: Perform resistance training  exercises routinely during rehab and add in resistance training at home;Long Term: Improve cardiorespiratory fitness, muscular endurance and strength as measured by increased METs and functional capacity (6MWT)       Able to understand and use rate  of perceived exertion (RPE) scale Yes       Intervention Provide education and explanation on how to use RPE scale       Expected Outcomes Short Term: Able to use RPE daily in rehab to express subjective intensity level;Long Term:  Able to use RPE to guide intensity level when exercising independently       Knowledge and understanding of Target Heart Rate Range (THRR) Yes       Intervention Provide education and explanation of THRR including how the numbers were predicted and where they are located for reference       Expected Outcomes Short Term: Able to state/look up THRR;Short Term: Able to use daily as guideline for intensity in rehab;Long Term: Able to use THRR to govern intensity when exercising independently       Understanding of Exercise Prescription Yes       Intervention Provide education, explanation, and written materials on patient's individual exercise prescription       Expected Outcomes Short Term: Able to explain program exercise prescription;Long Term: Able to explain home exercise prescription to exercise independently                Exercise Goals Re-Evaluation :    Discharge Exercise Prescription (Final Exercise Prescription Changes):   Nutrition:  Target Goals: Understanding of nutrition guidelines, daily intake of sodium 1500mg , cholesterol 200mg , calories 30% from fat and 7% or less from saturated fats, daily to have 5 or more servings of fruits and vegetables.  Biometrics:  Pre Biometrics - 03/23/21 1525       Pre Biometrics   Waist Circumference 34 inches    Hip Circumference 36.25 inches    Waist to Hip Ratio 0.94 %    Triceps Skinfold 5 mm    % Body Fat 18.3 %    Grip Strength 45 kg    Flexibility 45 in     Single Leg Stand 30 seconds              Nutrition Therapy Plan and Nutrition Goals:   Nutrition Assessments:  MEDIFICTS Score Key: ?70 Need to make dietary changes  40-70 Heart Healthy Diet ? 40 Therapeutic Level Cholesterol Diet   Picture Your Plate Scores: <53 Unhealthy dietary pattern with much room for improvement. 41-50 Dietary pattern unlikely to meet recommendations for good health and room for improvement. 51-60 More healthful dietary pattern, with some room for improvement.  >60 Healthy dietary pattern, although there may be some specific behaviors that could be improved.    Nutrition Goals Re-Evaluation:   Nutrition Goals Discharge (Final Nutrition Goals Re-Evaluation):   Psychosocial: Target Goals: Acknowledge presence or absence of significant depression and/or stress, maximize coping skills, provide positive support system. Participant is able to verbalize types and ability to use techniques and skills needed for reducing stress and depression.  Initial Review & Psychosocial Screening:  Initial Psych Review & Screening - 03/23/21 1545       Initial Review   Current issues with None Identified      Family Dynamics   Good Support System? 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      Screening Interventions   Interventions Encouraged to exercise             Quality of Life Scores:  Quality of Life - 03/23/21 1557  Quality of Life   Select Quality of Life      Quality of Life Scores   Health/Function Pre 18.07 %    Socioeconomic Pre 22.58 %    Psych/Spiritual Pre 24.64 %    Family Pre 19.5 %    GLOBAL Pre 20.65 %            Scores of 19 and below usually indicate a poorer quality of life in these areas.  A difference of  2-3 points is a clinically meaningful difference.  A difference of 2-3  points in the total score of the Quality of Life Index has been associated with significant improvement in overall quality of life, self-image, physical symptoms, and general health in studies assessing change in quality of life.  PHQ-9: Recent Review Flowsheet Data     Depression screen [facility] 2/9 03/23/2021   Decreased Interest 0   Down, Depressed, Hopeless 0   PHQ - 2 Score 0      Interpretation of Total Score  Total Score Depression Severity:  1-4 = Minimal depression, 5-9 = Mild depression, 10-14 = Moderate depression, 15-19 = Moderately severe depression, 20-27 = Severe depression   Psychosocial Evaluation and Intervention:   Psychosocial Re-Evaluation:   Psychosocial Discharge (Final Psychosocial Re-Evaluation):   Vocational Rehabilitation: Provide vocational rehab assistance to qualifying candidates.   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            Education: Education Goals: Education classes will be provided on a weekly basis, covering required topics. Participant will state understanding/return demonstration of topics presented.  Learning Barriers/Preferences:  Learning Barriers/Preferences - 03/23/21 1529       Learning Barriers/Preferences   Learning Barriers Sight   wears glasses   Learning Preferences Written Material;Skilled Demonstration;Computer/Internet;Audio;Verbal Instruction;Video;Group Instruction;Individual Instruction;Pictoral             Education Topics: Hypertension, Hypertension Reduction -Define heart disease and high blood pressure. Discus how high blood pressure affects the body and ways to reduce high blood pressure.   Exercise and Your Heart -Discuss why it is important to exercise, the  FITT principles of exercise, normal and abnormal responses to exercise, and how to exercise safely.   Angina -Discuss definition of angina, causes of angina, treatment of angina, and how to decrease risk of having angina.   Cardiac Medications -Review what the following cardiac medications are used for, how they affect the body, and side effects that may occur when taking the medications.  Medications include Aspirin, Beta blockers, calcium channel blockers, ACE Inhibitors, angiotensin receptor blockers, diuretics, digoxin, and antihyperlipidemics.   Congestive Heart Failure -Discuss the definition of CHF, how to live with CHF, the signs and symptoms of CHF, and how keep track of weight and sodium intake.   Heart Disease and Intimacy -Discus the effect sexual activity has on the heart, how changes occur during intimacy as we age, and safety during sexual activity.   Smoking Cessation / COPD -Discuss different methods to quit smoking, the health benefits of quitting smoking, and the definition of COPD.   Nutrition I: Fats -Discuss the types of cholesterol, what cholesterol does to the heart, and how cholesterol levels can be controlled.   Nutrition II: Labels -Discuss the different components of  food labels and how to read food label   Heart Parts/Heart Disease and PAD -Discuss the anatomy of the heart, the pathway of blood circulation through the heart, and these are affected by heart disease.   Stress I: Signs and Symptoms -Discuss the causes of stress, how stress may lead to anxiety and depression, and ways to limit stress.   Stress II: Relaxation -Discuss different types of relaxation techniques to limit stress.   Warning Signs of Stroke / TIA -Discuss definition of a stroke, what the signs and symptoms are of a stroke, and how to identify when someone is having stroke.   Knowledge Questionnaire Score:  Knowledge Questionnaire Score - 03/23/21 1531        Knowledge Questionnaire Score   Pre Score 21/24             Core Components/Risk Factors/Patient Goals at Admission:  Personal Goals and Risk Factors at Admission - 03/23/21 1531       Core Components/Risk Factors/Patient Goals on Admission    Weight Management Weight Maintenance;Yes    Intervention Weight Management: Provide education and appropriate resources to help participant work on and attain dietary goals.    Expected Outcomes Weight Maintenance: Understanding of the daily nutrition guidelines, which includes 25-35% calories from fat, 7% or less cal from saturated fats, less than 200mg  cholesterol, less than 1.5gm of sodium, & 5 or more servings of fruits and vegetables daily;Understanding recommendations for meals to include 15-35% energy as protein, 25-35% energy from fat, 35-60% energy from carbohydrates, less than 200mg  of dietary cholesterol, 20-35 gm of total fiber daily    Intervention Provide education and support for participant on nutrition & aerobic/resistive exercise along with prescribed medications to achieve LDL 70mg , HDL >40mg .    Expected Outcomes Short Term: Participant states understanding of desired cholesterol values and is compliant with medications prescribed. Participant is following exercise prescription and nutrition guidelines.;Long Term: Cholesterol controlled with medications as prescribed, with individualized exercise RX and with personalized nutrition plan. Value goals: LDL < 70mg , HDL > 40 mg.             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  Completed 6 minute walk test, Intitial ITP, and exercise prescription.   VSS. Telemetry-Sinus Rhythm.  Asymptomatic. Safety measures and social distancing in place per CDC guidelines. Barnet Pall, RN,BSN 03/24/2021 8:40 AM

## 2021-03-29 ENCOUNTER — Encounter (HOSPITAL_COMMUNITY)
Admission: RE | Admit: 2021-03-29 | Discharge: 2021-03-29 | Disposition: A | Discharge status: Home / Self Care | Payer: BC Managed Care – PPO | Source: Ambulatory Visit | Attending: Cardiovascular Disease | Admitting: Cardiovascular Disease

## 2021-03-29 ENCOUNTER — Other Ambulatory Visit: Payer: Self-pay

## 2021-03-29 DIAGNOSIS — Z48812 Encounter for surgical aftercare following surgery on the circulatory system: Secondary | ICD-10-CM | POA: Insufficient documentation

## 2021-03-29 DIAGNOSIS — Z955 Presence of coronary angioplasty implant and graft: Secondary | ICD-10-CM | POA: Diagnosis not present

## 2021-03-29 NOTE — Progress Notes (Signed)
Daily Session Note  Patient Details  Name: Christopher [name] MRN: [id number] Date of Birth: [birth year] Referring Provider:   Flowsheet Row CARDIAC REHAB PHASE II ORIENTATION from 03/23/2021 in [facility] CARDIAC REHAB  Referring Provider Lorine Bears, MD       Encounter Date: 03/29/2021  Check In:  Session Check In - 03/29/21 1348       Check-In   Supervising physician immediately available to respond to emergencies [facility] immediately available    Physician(s) Dr. Elvera Lennox    Location [facility]    Staff Present Lorin Picket, MS, ACSM-CEP, CCRP, Exercise Physiologist;Carlisle Torgeson, RN, BSN;Jetta Walker BS, ACSM EP-C, Exercise Physiologist;Olinty Kingston, MS, ACSM CEP, Exercise Physiologist;Lisa Kizzie Bane, RN    Virtual Visit No    Medication changes reported     No    Fall or balance concerns reported    No    Tobacco Cessation No Change    Current number of cigarettes/nicotine per day     0    Warm-up and Cool-down Performed on first and last piece of equipment    Resistance Training Performed No    VAD Patient? No    PAD/SET Patient? No      Pain Assessment   Currently in Pain? No/denies    Pain Score 0-No pain    Multiple Pain Sites No             Capillary Blood Glucose: No results found for this or any previous visit (from the past 24 hour(s)).   Exercise Prescription Changes - 03/29/21 1600       Response to Exercise   Blood Pressure (Admit) 108/62    Blood Pressure (Exercise) 106/70    Blood Pressure (Exit) 100/60    Heart Rate (Admit) 78 bpm    Heart Rate (Exercise) 106 bpm    Heart Rate (Exit) 72 bpm    Rating of Perceived Exertion (Exercise) 11    Symptoms None    Comments Pt's first day in the [facility]2 program    Duration Progress to 30 minutes of  aerobic without signs/symptoms of physical distress    Intensity THRR unchanged      Progression   Progression Continue to progress workloads to maintain  intensity without signs/symptoms of physical distress.    Average METs 4.37      Resistance Training   Training Prescription No    Weight No weights on Wednesdays      Interval Training   Interval Training No      Treadmill   MPH 2.8    Grade 1    Minutes 15    METs 3.53      Bike   Level 2.5    Minutes 15    METs 5.2             Social History   Tobacco Use  Smoking Status Never  Smokeless Tobacco Never    Goals Met:  Exercise tolerated well No report of cardiac concerns or symptoms  Goals Unmet:  Not Applicable  Comments: Dr Shelbie Ammons started cardiac rehab today.  Pt tolerated light exercise without difficulty. VSS, telemetry-Sinus rhythm, asymptomatic.  Medication list reconciled. Pt denies barriers to medicaiton compliance.  PSYCHOSOCIAL ASSESSMENT:  PHQ-0. Pt exhibits positive coping skills, hopeful outlook with supportive family. No psychosocial needs identified at this time, no psychosocial interventions necessary.    Pt enjoys reading and sports.   Pt oriented to exercise equipment and routine.  Understanding verbalized. Barnet Pall, RN,BSN 03/29/2021 4:38 PM    Dr. Fransico Him is Medical Director for Cardiac Rehab at [facility].

## 2021-03-31 ENCOUNTER — Encounter (HOSPITAL_COMMUNITY)
Admission: RE | Admit: 2021-03-31 | Discharge: 2021-03-31 | Disposition: A | Discharge status: Home / Self Care | Payer: BC Managed Care – PPO | Source: Ambulatory Visit | Attending: Cardiovascular Disease | Admitting: Cardiovascular Disease

## 2021-03-31 ENCOUNTER — Other Ambulatory Visit: Payer: Self-pay

## 2021-03-31 DIAGNOSIS — Z955 Presence of coronary angioplasty implant and graft: Secondary | ICD-10-CM

## 2021-03-31 NOTE — Progress Notes (Signed)
Incomplete Session Note  Patient Details  Name: Christopher [name] MRN: [id number] Date of Birth: Nov 01, 1955 Referring Provider:   Flowsheet Row CARDIAC REHAB PHASE II ORIENTATION from 03/23/2021 in [place]  Referring Provider Kathlyn Sacramento, MD       [facility] Christopher [name] did not complete his rehab session.  Christopher [name] pre exercise 95/49.  Offered water to drink, Christopher [name] declined.  Christopher [name] is not eating or drinking due to his participation in fasting as religous observation.  Pt should have some food consumption as well as the ability to drink water when bp is lower than usual.  Pt advised not to exercise today at cardiac rehab.  Pt verbalized understanding and will return on Monday. Christopher [name], BSN Cardiac and Training and development officer

## 2021-04-03 ENCOUNTER — Other Ambulatory Visit: Payer: Self-pay

## 2021-04-03 ENCOUNTER — Encounter (HOSPITAL_COMMUNITY)
Admission: RE | Admit: 2021-04-03 | Discharge: 2021-04-03 | Disposition: A | Discharge status: Home / Self Care | Payer: BC Managed Care – PPO | Source: Ambulatory Visit | Attending: Cardiovascular Disease | Admitting: Cardiovascular Disease

## 2021-04-03 DIAGNOSIS — Z48812 Encounter for surgical aftercare following surgery on the circulatory system: Secondary | ICD-10-CM | POA: Diagnosis not present

## 2021-04-03 DIAGNOSIS — Z955 Presence of coronary angioplasty implant and graft: Secondary | ICD-10-CM

## 2021-04-05 ENCOUNTER — Encounter (HOSPITAL_COMMUNITY)
Admission: RE | Admit: 2021-04-05 | Discharge: 2021-04-05 | Disposition: A | Discharge status: Home / Self Care | Payer: BC Managed Care – PPO | Source: Ambulatory Visit | Attending: Cardiovascular Disease | Admitting: Cardiovascular Disease

## 2021-04-05 ENCOUNTER — Other Ambulatory Visit: Payer: Self-pay

## 2021-04-05 VITALS — Wt 136.7 lb

## 2021-04-05 DIAGNOSIS — Z955 Presence of coronary angioplasty implant and graft: Secondary | ICD-10-CM

## 2021-04-05 DIAGNOSIS — Z48812 Encounter for surgical aftercare following surgery on the circulatory system: Secondary | ICD-10-CM | POA: Diagnosis not present

## 2021-04-07 ENCOUNTER — Encounter (HOSPITAL_COMMUNITY)
Admission: RE | Admit: 2021-04-07 | Discharge: 2021-04-07 | Disposition: A | Discharge status: Home / Self Care | Payer: BC Managed Care – PPO | Source: Ambulatory Visit | Attending: Cardiovascular Disease | Admitting: Cardiovascular Disease

## 2021-04-07 ENCOUNTER — Other Ambulatory Visit: Payer: Self-pay

## 2021-04-07 DIAGNOSIS — Z48812 Encounter for surgical aftercare following surgery on the circulatory system: Secondary | ICD-10-CM | POA: Diagnosis not present

## 2021-04-07 DIAGNOSIS — Z955 Presence of coronary angioplasty implant and graft: Secondary | ICD-10-CM

## 2021-04-10 ENCOUNTER — Other Ambulatory Visit: Payer: Self-pay

## 2021-04-10 ENCOUNTER — Encounter (HOSPITAL_COMMUNITY)
Admission: RE | Admit: 2021-04-10 | Discharge: 2021-04-10 | Disposition: A | Discharge status: Home / Self Care | Payer: BC Managed Care – PPO | Source: Ambulatory Visit | Attending: Cardiovascular Disease | Admitting: Cardiovascular Disease

## 2021-04-10 DIAGNOSIS — Z48812 Encounter for surgical aftercare following surgery on the circulatory system: Secondary | ICD-10-CM | POA: Diagnosis not present

## 2021-04-10 DIAGNOSIS — Z955 Presence of coronary angioplasty implant and graft: Secondary | ICD-10-CM

## 2021-04-11 NOTE — Progress Notes (Signed)
Brady M Klump 65 y.o. male Nutrition Note  Diagnosis: DES MLAD, DES LCFX  Past Medical History:  Diagnosis Date   Anxiety    Cervical disc disease    Coronary artery disease    GERD (gastroesophageal reflux disease)    triggers by eating spicy food per pt   Raynaud's disease    Vitamin B 12 deficiency    Vitamin D deficiency      Medications reviewed.   Current Outpatient Medications:    aspirin EC 81 MG tablet, Take 1 tablet (81 mg total) by mouth daily. Swallow whole., Disp: 90 tablet, Rfl: 3   Cholecalciferol (VITAMIN D3) 50 MCG (2000 UT) TABS, Take 2,000 Units by mouth., Disp: , Rfl:    metoprolol tartrate (LOPRESSOR) 25 MG tablet, Take 0.5 tablets (12.5 mg total) by mouth 2 (two) times daily., Disp: 45 tablet, Rfl: 1   nitroGLYCERIN (NITROSTAT) 0.4 MG SL tablet, Place 1 tablet (0.4 mg total) under the tongue every 5 (five) minutes as needed for chest pain., Disp: 25 tablet, Rfl: 2   pantoprazole (PROTONIX) 40 MG tablet, Take 40 mg by mouth daily., Disp: , Rfl:    rifaximin (XIFAXAN) 550 MG TABS tablet, Take 1 tablet (550 mg total) by mouth 3 (three) times daily. (Patient not taking: Reported on 03/24/2021), Disp: 42 tablet, Rfl: 0   rosuvastatin (CRESTOR) 40 MG tablet, Take 1 tablet (40 mg total) by mouth daily., Disp: 30 tablet, Rfl: 5   ticagrelor (BRILINTA) 90 MG TABS tablet, Take 1 tablet (90 mg total) by mouth 2 (two) times daily., Disp: 60 tablet, Rfl: 11   Ht Readings from Last 1 Encounters:  03/23/21 5' 6.5" (1.689 m)     Wt Readings from Last 3 Encounters:  04/10/21 136 lb 11 oz (62 kg)  03/23/21 136 lb 11 oz (62 kg)  12/27/20 140 lb 12.8 oz (63.9 kg)     There is no height or weight on file to calculate BMI.   Social History   Tobacco Use  Smoking Status Never  Smokeless Tobacco Never     Lab Results  Component Value Date   CHOL 122 11/19/2020   Lab Results  Component Value Date   HDL 32 (L) 11/19/2020   Lab Results  Component Value Date    LDLCALC 73 11/19/2020   Lab Results  Component Value Date   TRIG 85 11/19/2020     No results found for: HGBA1C   CBG (last 3)  No results for input(s): GLUCAP in the last 72 hours.   Nutrition Note  Spoke with pt. Nutrition Plan and Nutrition Survey goals reviewed with pt. Pt is following a Heart Healthy diet. Pt reports eating a traditional middle [place].  No sugary beverages.  He eats whole grain products, fruits, vegetables, and a lot of beans/rice.  He does read labels. He does not use salt shaker.  Pt reports high stress related to heart event. He is worried about calcification of arteries and need for stents again in the future. He feels he does not know how to cope with health related stress and some job stress. Recommended pt contact PCP if he decides he wants to see a mental health professional. Pt reports fatigue often. He states feeling very tired during the day at work.  Diet recall: Breakfast: 2 ww toast, honey, cheese, and fruit Lunch: 3-4 pieces fruit Dinner: white rice OR bulgur, beans, small portion chicken or beef, and vegetables Snack: dates and hot tea after dinner  HDL below goal. Pt is exercising 3 days per week in cardiac rehab. He is nervous to exercise outside of rehab.   Pt expressed understanding of the information reviewed.    Nutrition Diagnosis Food-and nutrition-related knowledge deficit related to lack of exposure to information as related to diagnosis of: ? CVD ?   Nutrition Intervention Pt's individual nutrition plan reviewed with pt. Benefits of adopting Heart Healthy diet discussed when Picture Your Plate reviewed.   Pt given handouts for: ? Nutrition I class ? Nutrition II class ?  Continue client-centered nutrition education by RD, as part of interdisciplinary care.  Goal(s) Pt to build a healthy plate including vegetables, fruits, whole grains, and low-fat dairy products in a heart healthy meal plan. Pt to choose a protein  source with his lunch for increased satiety   Plan:  Will provide client-centered nutrition education as part of interdisciplinary care Monitor and evaluate progress toward nutrition goal with team.   Michaele Offer, MS, RDN, LDN

## 2021-04-12 ENCOUNTER — Other Ambulatory Visit: Payer: Self-pay

## 2021-04-12 ENCOUNTER — Encounter (HOSPITAL_COMMUNITY)
Admission: RE | Admit: 2021-04-12 | Discharge: 2021-04-12 | Disposition: A | Discharge status: Home / Self Care | Payer: BC Managed Care – PPO | Source: Ambulatory Visit | Attending: Cardiovascular Disease | Admitting: Cardiovascular Disease

## 2021-04-12 DIAGNOSIS — Z955 Presence of coronary angioplasty implant and graft: Secondary | ICD-10-CM

## 2021-04-12 DIAGNOSIS — Z48812 Encounter for surgical aftercare following surgery on the circulatory system: Secondary | ICD-10-CM | POA: Diagnosis not present

## 2021-04-13 NOTE — Progress Notes (Signed)
Cardiac Individual Treatment Plan  Patient Details  Name: Christopher [name] MRN: [id number] Date of Birth: [birth year] Referring Provider:   Flowsheet Row CARDIAC REHAB PHASE II ORIENTATION from 03/23/2021 in [facility]  Referring Provider Kathlyn Sacramento, MD       Initial Encounter Date:  [place] PHASE II ORIENTATION from 03/23/2021 in [facility]  Date 03/23/21       Visit Diagnosis: 11/18/20 DES MID LAD, 12/16/20 DES LCFX  Patient's Home Medications on Admission:  Current Outpatient Medications:    aspirin EC 81 MG tablet, Take 1 tablet (81 mg total) by mouth daily. Swallow whole., Disp: 90 tablet, Rfl: 3   Cholecalciferol (VITAMIN D3) 50 MCG (2000 UT) TABS, Take 2,000 Units by mouth., Disp: , Rfl:    metoprolol tartrate (LOPRESSOR) 25 MG tablet, Take 0.5 tablets (12.5 mg total) by mouth 2 (two) times daily., Disp: 45 tablet, Rfl: 1   nitroGLYCERIN (NITROSTAT) 0.4 MG SL tablet, Place 1 tablet (0.4 mg total) under the tongue every 5 (five) minutes as needed for chest pain., Disp: 25 tablet, Rfl: 2   pantoprazole (PROTONIX) 40 MG tablet, Take 40 mg by mouth daily., Disp: , Rfl:    rifaximin (XIFAXAN) 550 MG TABS tablet, Take 1 tablet (550 mg total) by mouth 3 (three) times daily. (Patient not taking: Reported on 03/24/2021), Disp: 42 tablet, Rfl: 0   rosuvastatin (CRESTOR) 40 MG tablet, Take 1 tablet (40 mg total) by mouth daily., Disp: 30 tablet, Rfl: 5   ticagrelor (BRILINTA) 90 MG TABS tablet, Take 1 tablet (90 mg total) by mouth 2 (two) times daily., Disp: 60 tablet, Rfl: 11  Past Medical History: Past Medical History:  Diagnosis Date   Anxiety    Cervical disc disease    Coronary artery disease    GERD (gastroesophageal reflux disease)    triggers by eating spicy food per pt   Raynaud's disease    Vitamin B 12 deficiency    Vitamin D deficiency     Tobacco Use: Social History   Tobacco Use   Smoking Status Never  Smokeless Tobacco Never    Labs: Recent Review Flowsheet Data     Labs for ITP Cardiac and Pulmonary Rehab Latest Ref Rng & Units 11/19/2020   Cholestrol 0 - 200 mg/dL 122   LDLCALC 0 - 99 mg/dL 73   HDL >40 mg/dL 32(L)   Trlycerides <150 mg/dL 85       Capillary Blood Glucose: No results found for: GLUCAP   Exercise Target Goals: Exercise Program Goal: Individual exercise prescription set using results from initial 6 min walk test and THRR while considering  patient's activity barriers and safety.   Exercise Prescription Goal: Starting with aerobic activity 30 plus minutes a day, 3 days per week for initial exercise prescription. Provide home exercise prescription and guidelines that participant acknowledges understanding prior to discharge.  Activity Barriers & Risk Stratification:  Activity Barriers & Cardiac Risk Stratification - 03/23/21 1539       Activity Barriers & Cardiac Risk Stratification   Activity Barriers Back Problems    Cardiac Risk Stratification High             6 Minute Walk:  6 Minute Walk     Row Name 03/23/21 1459         6 Minute Walk   Phase Initial     Distance 1742 feet     Walk Time 6  minutes     # of Rest Breaks 0     MPH 3.3     METS 3.96     RPE 9     Perceived Dyspnea  0     VO2 Peak 13.86     Symptoms No     Resting HR 66 bpm     Resting BP 102/60     Resting Oxygen Saturation  97 %     Exercise Oxygen Saturation  during 6 min walk 99 %     Max Ex. HR 92 bpm     Max Ex. BP 114/70     2 Minute Post BP 100/68              Oxygen Initial Assessment:   Oxygen Re-Evaluation:   Oxygen Discharge (Final Oxygen Re-Evaluation):   Initial Exercise Prescription:  Initial Exercise Prescription - 03/23/21 1500       Date of Initial Exercise RX and Referring Provider   Date 03/23/21    Referring Provider Kathlyn Sacramento, MD    Expected Discharge Date 05/19/21      Treadmill   MPH 2.8     Grade 1    Minutes 15    METs 3.53      Bike   Level 2.5    Minutes 15    METs 3.5      Prescription Details   Frequency (times per week) 3    Duration Progress to 30 minutes of continuous aerobic without signs/symptoms of physical distress      Intensity   THRR 40-80% of Max Heartrate 61-122    Ratings of Perceived Exertion 11-13    Perceived Dyspnea 0-4      Progression   Progression Continue progressive overload as per policy without signs/symptoms or physical distress.      Resistance Training   Training Prescription Yes    Weight 3 lbs    Reps 10-15             Perform Capillary Blood Glucose checks as needed.  Exercise Prescription Changes:   Exercise Prescription Changes     Row Name 03/29/21 1600 04/10/21 1445           Response to Exercise   Blood Pressure (Admit) 108/62 112/60      Blood Pressure (Exercise) 106/70 112/60      Blood Pressure (Exit) 100/60 106/72      Heart Rate (Admit) 78 bpm 75 bpm      Heart Rate (Exercise) 106 bpm 107 bpm      Heart Rate (Exit) 72 bpm 60 bpm      Rating of Perceived Exertion (Exercise) 11 9      Symptoms None None      Comments Pt's first day in the CRP2 program Reviewed METs/Increased workload      Duration Progress to 30 minutes of  aerobic without signs/symptoms of physical distress Continue with 30 min of aerobic exercise without signs/symptoms of physical distress.      Intensity THRR unchanged THRR unchanged             Progression      Progression Continue to progress workloads to maintain intensity without signs/symptoms of physical distress. Continue to progress workloads to maintain intensity without signs/symptoms of physical distress.      Average METs 4.37 4.75             Resistance Training      Training Prescription No Yes  Weight No weights on Wednesdays 3 lbs      Reps -- 10-15      Time -- 10 Minutes             Interval Training      Interval Training No No              Treadmill      MPH 2.8 2.8      Grade 1 3      Minutes 15 15      METs 3.53 4.3             Bike      Level 2.5 3      Minutes 15 15      METs 5.2 5.2              Exercise Comments:   Exercise Comments     Row Name 03/29/21 1608 04/10/21 1455         Exercise Comments Pt's first day in the CRP2 program. Pt tolerated the sesion well with no complaints. Pt is off to a good start. Reviewed METs. Pt is making excellent progress in the C RP2 program. Pt has average METs level of 4.75.               Exercise Goals and Review:   Exercise Goals     Row Name 03/23/21 1540             Exercise Goals   Increase Physical Activity Yes       Intervention Provide advice, education, support and counseling about physical activity/exercise needs.;Develop an individualized exercise prescription for aerobic and resistive training based on initial evaluation findings, risk stratification, comorbidities and participant's personal goals.       Expected Outcomes Short Term: Attend rehab on a regular basis to increase amount of physical activity.;Long Term: Add in home exercise to make exercise part of routine and to increase amount of physical activity.;Long Term: Exercising regularly at least 3-5 days a week.       Increase Strength and Stamina Yes       Intervention Provide advice, education, support and counseling about physical activity/exercise needs.;Develop an individualized exercise prescription for aerobic and resistive training based on initial evaluation findings, risk stratification, comorbidities and participant's personal goals.       Expected Outcomes Short Term: Increase workloads from initial exercise prescription for resistance, speed, and METs.;Short Term: Perform resistance training exercises routinely during rehab and add in resistance training at home;Long Term: Improve cardiorespiratory fitness, muscular endurance and strength as measured by increased METs and  functional capacity (6MWT)       Able to understand and use rate of perceived exertion (RPE) scale Yes       Intervention Provide education and explanation on how to use RPE scale       Expected Outcomes Short Term: Able to use RPE daily in rehab to express subjective intensity level;Long Term:  Able to use RPE to guide intensity level when exercising independently       Knowledge and understanding of Target Heart Rate Range (THRR) Yes       Intervention Provide education and explanation of THRR including how the numbers were predicted and where they are located for reference       Expected Outcomes Short Term: Able to state/look up THRR;Short Term: Able to use daily as guideline for intensity in rehab;Long Term: Able to use THRR to govern intensity when exercising independently  Understanding of Exercise Prescription Yes       Intervention Provide education, explanation, and written materials on patient's individual exercise prescription       Expected Outcomes Short Term: Able to explain program exercise prescription;Long Term: Able to explain home exercise prescription to exercise independently                Exercise Goals Re-Evaluation :  Exercise Goals Re-Evaluation     Row Name 03/29/21 1607             Exercise Goal Re-Evaluation   Exercise Goals Review Increase Physical Activity;Increase Strength and Stamina;Able to understand and use rate of perceived exertion (RPE) scale;Knowledge and understanding of Target Heart Rate Range (THRR);Understanding of Exercise Prescription       Comments Pt's first day in the CRP2 program. Pt understands the exercise RX, THRR, and RPE scale.       Expected Outcomes Will continue to monitor patient and progress exercise workloads as tolerated.                 Discharge Exercise Prescription (Final Exercise Prescription Changes):  Exercise Prescription Changes - 04/10/21 1445       Response to Exercise   Blood Pressure (Admit)  112/60    Blood Pressure (Exercise) 112/60    Blood Pressure (Exit) 106/72    Heart Rate (Admit) 75 bpm    Heart Rate (Exercise) 107 bpm    Heart Rate (Exit) 60 bpm    Rating of Perceived Exertion (Exercise) 9    Symptoms None    Comments Reviewed METs/Increased workload    Duration Continue with 30 min of aerobic exercise without signs/symptoms of physical distress.    Intensity THRR unchanged      Progression   Progression Continue to progress workloads to maintain intensity without signs/symptoms of physical distress.    Average METs 4.75      Resistance Training   Training Prescription Yes    Weight 3 lbs    Reps 10-15    Time 10 Minutes      Interval Training   Interval Training No      Treadmill   MPH 2.8    Grade 3    Minutes 15    METs 4.3      Bike   Level 3    Minutes 15    METs 5.2             Nutrition:  Target Goals: Understanding of nutrition guidelines, daily intake of sodium 1500mg , cholesterol 200mg , calories 30% from fat and 7% or less from saturated fats, daily to have 5 or more servings of fruits and vegetables.  Biometrics:  Pre Biometrics - 03/23/21 1525       Pre Biometrics   Waist Circumference 34 inches    Hip Circumference 36.25 inches    Waist to Hip Ratio 0.94 %    Triceps Skinfold 5 mm    % Body Fat 18.3 %    Grip Strength 45 kg    Flexibility 45 in    Single Leg Stand 30 seconds              Nutrition Therapy Plan and Nutrition Goals:  Nutrition Therapy & Goals - 04/11/21 1030       Nutrition Therapy   Diet TLC    Drug/Food Interactions Statins/Certain Fruits      Personal Nutrition Goals   Nutrition Goal Pt to build a healthy plate including vegetables, fruits, whole  grains, and low-fat dairy products in a heart healthy meal plan.    Personal Goal #2 Pt to choose a protein source with his lunch for increased satiety      Intervention Plan   Intervention Prescribe, educate and counsel regarding  individualized specific dietary modifications aiming towards targeted core components such as weight, hypertension, lipid management, diabetes, heart failure and other comorbidities.;Nutrition handout(s) given to patient.    Expected Outcomes Long Term Goal: Adherence to prescribed nutrition plan.;Short Term Goal: Understand basic principles of dietary content, such as calories, fat, sodium, cholesterol and nutrients.             Nutrition Assessments:  MEDIFICTS Score Key: ?70 Need to make dietary changes  40-70 Heart Healthy Diet ? 40 Therapeutic Level Cholesterol Diet  Flowsheet Row CARDIAC REHAB PHASE II EXERCISE from 04/10/2021 in [facility]  Picture Your Plate Total Score on Admission 64      Picture Your Plate Scores: <42 Unhealthy dietary pattern with much room for improvement. 41-50 Dietary pattern unlikely to meet recommendations for good health and room for improvement. 51-60 More healthful dietary pattern, with some room for improvement.  >60 Healthy dietary pattern, although there may be some specific behaviors that could be improved.    Nutrition Goals Re-Evaluation:  Nutrition Goals Re-Evaluation     [place] Name 04/11/21 1031             Goals   Current Weight 136 lb (61.7 kg)       Nutrition Goal Pt to build a healthy plate including vegetables, fruits, whole grains, and low-fat dairy products in a heart healthy meal plan.               Personal Goal #2 Re-Evaluation     Personal Goal #2 Pt to choose a protein source with his lunch for increased satiety               Nutrition Goals Discharge (Final Nutrition Goals Re-Evaluation):  Nutrition Goals Re-Evaluation - 04/11/21 1031       Goals   Current Weight 136 lb (61.7 kg)    Nutrition Goal Pt to build a healthy plate including vegetables, fruits, whole grains, and low-fat dairy products in a heart healthy meal plan.      Personal Goal #2 Re-Evaluation   Personal  Goal #2 Pt to choose a protein source with his lunch for increased satiety             Psychosocial: Target Goals: Acknowledge presence or absence of significant depression and/or stress, maximize coping skills, provide positive support system. Participant is able to verbalize types and ability to use techniques and skills needed for reducing stress and depression.  Initial Review & Psychosocial Screening:  Initial Psych Review & Screening - 03/23/21 1545       Initial Review   Current issues with None Identified      Family Dynamics   Good Support System? 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      Screening Interventions   Interventions Encouraged to exercise             Quality of Life Scores:  Quality of Life - 03/23/21 1557       Quality of Life   Select Quality of Life  Quality of Life Scores   Health/Function Pre 18.07 %    Socioeconomic Pre 22.58 %    Psych/Spiritual Pre 24.64 %    Family Pre 19.5 %    GLOBAL Pre 20.65 %            Scores of 19 and below usually indicate a poorer quality of life in these areas.  A difference of  2-3 points is a clinically meaningful difference.  A difference of 2-3 points in the total score of the Quality of Life Index has been associated with significant improvement in overall quality of life, self-image, physical symptoms, and general health in studies assessing change in quality of life.  PHQ-9: Recent Review Flowsheet Data     Depression screen [facility] 2/9 03/23/2021   Decreased Interest 0   Down, Depressed, Hopeless 0   PHQ - 2 Score 0      Interpretation of Total Score  Total Score Depression Severity:  1-4 = Minimal depression, 5-9 = Mild depression, 10-14 = Moderate depression, 15-19 = Moderately severe depression, 20-27 = Severe depression   Psychosocial Evaluation  and Intervention:   Psychosocial Re-Evaluation:  Psychosocial Re-Evaluation     Row Name 04/13/21 1325             Psychosocial Re-Evaluation   Current issues with Current Stress Concerns       Comments 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       Expected Outcomes Dr Despina Pole will have decreased stress upon completion of phase 2 cardiac rehab       Interventions Stress management education;Encouraged to attend Cardiac Rehabilitation for the exercise;Relaxation education       Continue Psychosocial Services  Follow up required by staff               Initial Review     Source of Stress Concerns Chronic Illness;Occupation       Comments Will continue to monitor and offer support as needed               Psychosocial Discharge (Final Psychosocial Re-Evaluation):  Psychosocial Re-Evaluation - 04/13/21 1325       Psychosocial Re-Evaluation   Current issues with Current Stress Concerns    Comments 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            Education: Education Goals: Education classes will be provided  on a weekly basis, covering required topics. Participant will state understanding/return demonstration of topics presented.  Learning Barriers/Preferences:  Learning Barriers/Preferences - 03/23/21 1529       Learning Barriers/Preferences  Learning Barriers Sight   wears glasses   Learning Preferences Written Material;Skilled Demonstration;Computer/Internet;Audio;Verbal Instruction;Video;Group Instruction;Individual Instruction;Pictoral             Education Topics: Hypertension, Hypertension Reduction -Define heart disease and high blood pressure. Discus how high blood pressure affects the body and ways to reduce high blood pressure.   Exercise and Your Heart -Discuss why it is important to exercise, the FITT principles of exercise, normal and abnormal responses to exercise, and how to exercise safely.   Angina -Discuss definition of angina, causes of angina, treatment of angina, and how to decrease risk of having angina.   Cardiac Medications -Review what the following cardiac medications are used for, how they affect the body, and side effects that may occur when taking the medications.  Medications include Aspirin, Beta blockers, calcium channel blockers, ACE Inhibitors, angiotensin receptor blockers, diuretics, digoxin, and antihyperlipidemics.   Congestive Heart Failure -Discuss the definition of CHF, how to live with CHF, the signs and symptoms of CHF, and how keep track of weight and sodium intake.   Heart Disease and Intimacy -Discus the effect sexual activity has on the heart, how changes occur during intimacy as we age, and safety during sexual activity.   Smoking Cessation / COPD -Discuss different methods to quit smoking, the health benefits of quitting smoking, and the definition of COPD.   Nutrition I: Fats -Discuss the types of cholesterol, what cholesterol does to the heart, and how cholesterol levels can be controlled.   Nutrition II:  Labels -Discuss the different components of food labels and how to read food label   Heart Parts/Heart Disease and PAD -Discuss the anatomy of the heart, the pathway of blood circulation through the heart, and these are affected by heart disease.   Stress I: Signs and Symptoms -Discuss the causes of stress, how stress may lead to anxiety and depression, and ways to limit stress.   Stress II: Relaxation -Discuss different types of relaxation techniques to limit stress.   Warning Signs of Stroke / TIA -Discuss definition of a stroke, what the signs and symptoms are of a stroke, and how to identify when someone is having stroke.   Knowledge Questionnaire Score:  Knowledge Questionnaire Score - 03/23/21 1531       Knowledge Questionnaire Score   Pre Score 21/24             Core Components/Risk Factors/Patient Goals at Admission:  Personal Goals and Risk Factors at Admission - 03/23/21 1531       Core Components/Risk Factors/Patient Goals on Admission    Weight Management Weight Maintenance;Yes    Intervention Weight Management: Provide education and appropriate resources to help participant work on and attain dietary goals.    Expected Outcomes Weight Maintenance: Understanding of the daily nutrition guidelines, which includes 25-35% calories from fat, 7% or less cal from saturated fats, less than 200mg  cholesterol, less than 1.5gm of sodium, & 5 or more servings of fruits and vegetables daily;Understanding recommendations for meals to include 15-35% energy as protein, 25-35% energy from fat, 35-60% energy from carbohydrates, less than 200mg  of dietary cholesterol, 20-35 gm of total fiber daily    Intervention Provide education and support for participant on nutrition & aerobic/resistive exercise along with prescribed medications to achieve LDL 70mg , HDL >40mg .    Expected Outcomes Short Term: Participant states understanding of desired cholesterol values and is compliant with  medications prescribed. Participant is following exercise prescription and nutrition guidelines.;Long Term: Cholesterol controlled with medications  as prescribed, with individualized exercise RX and with personalized nutrition plan. Value goals: LDL < 70mg , HDL > 40 mg.             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 Dr Despina Pole has been doing well with exercise. Vital signs have been stable. Dr Despina Pole has lost 2 kg since starting phase 2 CR      Expected Outcomes Dr Despina Pole will continue to particiapte in phase 2 cardiac rehab for exercise, nutrition and lifestyle modifications. Dr Despina Pole will continue to particiapte in phase 2 cardiac rehab for exercise, nutrition and lifestyle modifications.               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 Vital signs have been stable. Dr Despina Pole has lost 2 kg since starting phase 2 CR    Expected Outcomes Dr Despina Pole will continue to particiapte in phase 2 cardiac rehab for exercise, nutrition and lifestyle modifications.             ITP Comments:  ITP Comments     Row Name 03/23/21 1544 03/30/21 0747 04/13/21 1323       ITP Comments Dr Fransico Him MD, Medical Director 30 Day ITP Review. Dr Fransico Him MD, Medical Director 30 Day ITP Review. 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 Patient has good attendance and participation.               Comments: See ITP comments.Barnet Pall, RN,BSN 04/13/2021 1:31 PM

## 2021-04-14 ENCOUNTER — Other Ambulatory Visit: Payer: Self-pay

## 2021-04-14 ENCOUNTER — Encounter (HOSPITAL_COMMUNITY)
Admission: RE | Admit: 2021-04-14 | Discharge: 2021-04-14 | Disposition: A | Discharge status: Home / Self Care | Payer: BC Managed Care – PPO | Source: Ambulatory Visit | Attending: Cardiovascular Disease | Admitting: Cardiovascular Disease

## 2021-04-14 DIAGNOSIS — Z955 Presence of coronary angioplasty implant and graft: Secondary | ICD-10-CM

## 2021-04-14 DIAGNOSIS — Z48812 Encounter for surgical aftercare following surgery on the circulatory system: Secondary | ICD-10-CM | POA: Diagnosis not present

## 2021-04-14 NOTE — Progress Notes (Signed)
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  Scores 19.0 or below are considered low.  Pt score very low in several areas Overall 20.65 , Health and Function 18.07, socioeconomic 22.58, physiological and spiritual 24.64, family 19.50. 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  Offered emotional support and reassurance.  Will continue to monitor and intervene as necessary.Will continue to monitor the patient throughout  the program. Barnet Pall, RN,BSN 04/14/2021 4:23 PM

## 2021-04-17 ENCOUNTER — Encounter (HOSPITAL_COMMUNITY): Payer: BC Managed Care – PPO

## 2021-04-17 NOTE — Progress Notes (Signed)
Reviewed home exercise Rx with patient today. Pt voices that he walks in the park sometimes in the evening. Encouraged to walk at least 2 x/week for 30-45 minutes in addition to his 3 x/week in the CRP2 program. Encouraged warm-up, cool-down, and stretching. Reviewed THRR of 61-122 and keeping RPE between 11-13. Hydration encouraged. Discussed weather parameters for temperature and humidity for safe exercise outdoors. Reviewed S/S to terminate exercise and when to call MD vs 911. Reviewed use of NTG and encouraged to carry at all times. Encouraged to carry cell phone when exercising outdoors. Pt verbalized understanding of the home exercise Rx and was provided a copy.   Lesly Rubenstein MS, ACSM-CEP, CCRP

## 2021-04-19 ENCOUNTER — Encounter (HOSPITAL_COMMUNITY)
Admission: RE | Admit: 2021-04-19 | Discharge: 2021-04-19 | Disposition: A | Discharge status: Home / Self Care | Payer: BC Managed Care – PPO | Source: Ambulatory Visit | Attending: Cardiovascular Disease | Admitting: Cardiovascular Disease

## 2021-04-19 ENCOUNTER — Other Ambulatory Visit: Payer: Self-pay

## 2021-04-19 DIAGNOSIS — Z955 Presence of coronary angioplasty implant and graft: Secondary | ICD-10-CM

## 2021-04-19 DIAGNOSIS — Z48812 Encounter for surgical aftercare following surgery on the circulatory system: Secondary | ICD-10-CM | POA: Diagnosis not present

## 2021-04-21 ENCOUNTER — Encounter (HOSPITAL_COMMUNITY): Payer: BC Managed Care – PPO

## 2021-04-24 ENCOUNTER — Encounter (HOSPITAL_COMMUNITY)
Admission: RE | Admit: 2021-04-24 | Discharge: 2021-04-24 | Disposition: A | Discharge status: Home / Self Care | Payer: BC Managed Care – PPO | Source: Ambulatory Visit | Attending: Cardiovascular Disease | Admitting: Cardiovascular Disease

## 2021-04-24 ENCOUNTER — Other Ambulatory Visit: Payer: Self-pay

## 2021-04-24 ENCOUNTER — Telehealth: Payer: Self-pay

## 2021-04-24 DIAGNOSIS — Z955 Presence of coronary angioplasty implant and graft: Secondary | ICD-10-CM | POA: Diagnosis present

## 2021-04-24 DIAGNOSIS — Z48812 Encounter for surgical aftercare following surgery on the circulatory system: Secondary | ICD-10-CM | POA: Diagnosis not present

## 2021-04-24 MED ORDER — ROSUVASTATIN CALCIUM 10 MG PO TABS: 10.0000 mg | ORAL_TABLET | Freq: Every day | ORAL | 1 refills | Status: DC

## 2021-04-24 MED ORDER — CLOPIDOGREL BISULFATE 75 MG PO TABS: ORAL_TABLET | ORAL | 1 refills | Status: DC

## 2021-04-24 NOTE — Telephone Encounter (Signed)
Spoke with the patient. Patient made aware of Dr. Tyrell Antonio recommendation.  Patient confirms that he is taking 20 mg daily of rosuvastatin. Pt will reduce rosuvastatin to 10 mg daily.  Patient will stop Brilinta. Start Plavix 75 mg daily, taking 2 tablets (150 mg) the first day.  Patient verbalized understanding and voiced appreciation for the call.

## 2021-04-24 NOTE — Telephone Encounter (Signed)
-----   Message from Wellington Hampshire, MD sent at 04/23/2021  9:43 PM EDT ----- He is having significant bruising with Brilinta.  Switch Brilinta to Plavix 75 mg once daily with 150 mg loading dose on first day.  He also reported myalgia with Crestor. He mentioned that he has the 20 mg tablet but our records indicate 40 mg. Can you verify Crestor dose and decrease the dose by half? Thanks.

## 2021-04-26 ENCOUNTER — Other Ambulatory Visit: Payer: Self-pay

## 2021-04-26 ENCOUNTER — Encounter (HOSPITAL_COMMUNITY)
Admission: RE | Admit: 2021-04-26 | Discharge: 2021-04-26 | Disposition: A | Discharge status: Home / Self Care | Payer: BC Managed Care – PPO | Source: Ambulatory Visit | Attending: Cardiovascular Disease | Admitting: Cardiovascular Disease

## 2021-04-26 DIAGNOSIS — Z955 Presence of coronary angioplasty implant and graft: Secondary | ICD-10-CM

## 2021-04-26 DIAGNOSIS — Z48812 Encounter for surgical aftercare following surgery on the circulatory system: Secondary | ICD-10-CM | POA: Diagnosis not present

## 2021-04-28 ENCOUNTER — Other Ambulatory Visit: Payer: Self-pay

## 2021-04-28 ENCOUNTER — Encounter (HOSPITAL_COMMUNITY)
Admission: RE | Admit: 2021-04-28 | Discharge: 2021-04-28 | Disposition: A | Discharge status: Home / Self Care | Payer: BC Managed Care – PPO | Source: Ambulatory Visit | Attending: Cardiovascular Disease | Admitting: Cardiovascular Disease

## 2021-04-28 DIAGNOSIS — Z955 Presence of coronary angioplasty implant and graft: Secondary | ICD-10-CM

## 2021-04-28 DIAGNOSIS — Z48812 Encounter for surgical aftercare following surgery on the circulatory system: Secondary | ICD-10-CM | POA: Diagnosis not present

## 2021-05-01 ENCOUNTER — Encounter (HOSPITAL_COMMUNITY): Payer: BC Managed Care – PPO

## 2021-05-03 ENCOUNTER — Encounter (HOSPITAL_COMMUNITY): Payer: BC Managed Care – PPO

## 2021-05-05 ENCOUNTER — Other Ambulatory Visit: Payer: Self-pay

## 2021-05-05 ENCOUNTER — Encounter (HOSPITAL_COMMUNITY)
Admission: RE | Admit: 2021-05-05 | Discharge: 2021-05-05 | Disposition: A | Discharge status: Home / Self Care | Payer: BC Managed Care – PPO | Source: Ambulatory Visit | Attending: Cardiovascular Disease | Admitting: Cardiovascular Disease

## 2021-05-05 VITALS — Ht 66.5 in | Wt 133.2 lb

## 2021-05-05 DIAGNOSIS — Z48812 Encounter for surgical aftercare following surgery on the circulatory system: Secondary | ICD-10-CM | POA: Diagnosis not present

## 2021-05-05 DIAGNOSIS — Z955 Presence of coronary angioplasty implant and graft: Secondary | ICD-10-CM

## 2021-05-05 NOTE — Progress Notes (Signed)
Discharge Progress Report  Patient Details  [name]: Christopher [name] MRN: [id number] Date of Birth: Oct 24, 1955 Referring Provider:   Flowsheet Row CARDIAC REHAB PHASE II ORIENTATION from 03/23/2021 in [facility]  Referring Provider Kathlyn Sacramento, MD        Number of Visits: 12  Reason for Discharge:  Patient reached a stable level of exercise. Patient independent in their exercise. Patient has met program and personal goals.  Smoking History:  Social History   Tobacco Use  Smoking Status Never  Smokeless Tobacco Never    Diagnosis:  11/18/20 DES MID LAD, 12/16/20 DES LCFX  ADL [facility]SD:   Initial Exercise Prescription:  Initial Exercise Prescription - 03/23/21 1500       Date of Initial Exercise RX and Referring Provider   Date 03/23/21    Referring Provider Kathlyn Sacramento, MD    Expected Discharge Date 05/19/21      Treadmill   MPH 2.8    Grade 1    Minutes 15    METs 3.53      Bike   Level 2.5    Minutes 15    METs 3.5      Prescription Details   Frequency (times per week) 3    Duration Progress to 30 minutes of continuous aerobic without signs/symptoms of physical distress      Intensity   THRR 40-80% of Max Heartrate 61-122    Ratings of Perceived Exertion 11-13    Perceived Dyspnea 0-4      Progression   Progression Continue progressive overload as per policy without signs/symptoms or physical distress.      Resistance Training   Training Prescription Yes    Weight 3 lbs    Reps 10-15             Discharge Exercise Prescription (Final Exercise Prescription Changes):  Exercise Prescription Changes - 04/14/21 1600       Response to Exercise   Blood Pressure (Admit) 104/54    Blood Pressure (Exercise) 112/64    Blood Pressure (Exit) 96/60    Heart Rate (Admit) 91 bpm    Heart Rate (Exercise) 115 bpm    Heart Rate (Exit) 91 bpm    Rating of Perceived Exertion (Exercise) 10    Symptoms None    Comments  Reviewed Home exercisde Rx    Duration Continue with 30 min of aerobic exercise without signs/symptoms of physical distress.    Intensity THRR unchanged      Progression   Progression Continue to progress workloads to maintain intensity without signs/symptoms of physical distress.    Average METs 4.95      Resistance Training   Training Prescription Yes    Weight 3 lbs    Reps 10-15    Time 10 Minutes      Interval Training   Interval Training No      Treadmill   MPH 2.8    Grade 3    Minutes 15    METs 4.3      Bike   Level 3    Minutes 15    METs 5.6      Home Exercise Plan   Plans to continue exercise at Home (comment)    Frequency Add 2 additional days to program exercise sessions.    Initial Home Exercises Provided 04/14/21             Functional Capacity:  6 Minute Walk     Row [name]  03/23/21 1459 05/05/21 1340       6 Minute Walk   Phase Initial Discharge    Distance 1742 feet 1915 feet    Distance % Change -- 9.93 %    Distance Feet Change -- 173 ft    Walk Time 6 minutes 6 minutes    # of Rest Breaks 0 0    MPH 3.3 3.63    METS 3.96 4.47    RPE 9 9    Perceived Dyspnea  0 0    VO2 Peak 13.86 15.65    Symptoms No No    Resting HR 66 bpm 77 bpm    Resting BP 102/60 98/56    Resting Oxygen Saturation  97 % 96 %    Exercise Oxygen Saturation  during 6 min walk 99 % 97 %    Max Ex. HR 92 bpm 104 bpm    Max Ex. BP 114/70 124/66    2 Minute Post BP 100/68 --             Psychological, QOL, Others - Outcomes: PHQ 2/9: Depression screen Christopher [name] 2/9 05/05/2021 03/23/2021  Decreased Interest 0 0  Down, Depressed, Hopeless 0 0  PHQ - 2 Score 0 0    Quality of Life:  Quality of Life - 05/05/21 1622       Quality of Life Scores   Health/Function Pre 18.07 %    Health/Function Post 21.03 %    Health/Function % Change 16.38 %    Socioeconomic Pre 22.58 %    Socioeconomic Post 21.5 %    Socioeconomic % Change  -4.78 %    Psych/Spiritual Pre  24.64 %    Psych/Spiritual Post 24 %    Psych/Spiritual % Change -2.6 %    Family Pre 19.5 %    Family Post 13.6 %    Family % Change -30.26 %    GLOBAL Pre 20.65 %    GLOBAL Post 20.55 %    GLOBAL % Change -0.48 %             Personal Goals: Goals established at orientation with interventions provided to work toward goal.  Personal Goals and Risk Factors at Admission - 03/23/21 1531       Core Components/Risk Factors/Patient Goals on Admission    Weight Management Weight Maintenance;Yes    Intervention Weight Management: Provide education and appropriate resources to help participant work on and attain dietary goals.    Expected Outcomes Weight Maintenance: Understanding of the daily nutrition guidelines, which includes 25-35% calories from fat, 7% or less cal from saturated fats, less than $RemoveB'200mg'SFRTHior$  cholesterol, less than 1.5gm of sodium, & 5 or more servings of fruits and vegetables daily;Understanding recommendations for meals to include 15-35% energy as protein, 25-35% energy from fat, 35-60% energy from carbohydrates, less than $RemoveB'200mg'hddFNBdJ$  of dietary cholesterol, 20-35 gm of total fiber daily    Intervention Provide education and support for participant on nutrition & aerobic/resistive exercise along with prescribed medications to achieve LDL '70mg'$ , HDL >$Remo'40mg'fGaXi$ .    Expected Outcomes Short Term: Participant states understanding of desired cholesterol values and is compliant with medications prescribed. Participant is following exercise prescription and nutrition guidelines.;Long Term: Cholesterol controlled with medications as prescribed, with individualized exercise RX and with personalized nutrition plan. Value goals: LDL < $Rem'70mg'cngy$ , HDL > 40 mg.              Personal Goals Discharge:  Goals and Risk Factor Review  Christopher [name] 03/30/21 0749 04/13/21 1327 05/18/21 0900         Core Components/Risk Factors/Patient Goals Review   Personal Goals Review Weight Management/Obesity;Lipids  Weight Management/Obesity;Lipids Weight Management/Obesity;Lipids     Review Christopher [name] started exercise at cardiac rehab on 03/29/21 and did well with exercise. Christopher [name] has been doing well with exercise. Vital signs have been stable. Christopher [name] has lost 2 kg since starting phase 2 CR Christopher [name] has been doing well with exercise. Vital signs have been stable. Christopher [name] completed cardiac rehab on 05/05/21.     Expected Outcomes Christopher [name] will continue to particiapte in phase 2 cardiac rehab for exercise, nutrition and lifestyle modifications. Christopher [name] will continue to particiapte in phase 2 cardiac rehab for exercise, nutrition and lifestyle modifications. Christopher [name] will continue to  exercise,  follow nutrition and lifestyle modifications upon completion of phase 2 cardiac rehab.              Exercise Goals and Review:  Exercise Goals     Row [name] 03/23/21 1540             Exercise Goals   Increase Physical Activity Yes       Intervention Provide advice, education, support and counseling about physical activity/exercise needs.;Develop an individualized exercise prescription for aerobic and resistive training based on initial evaluation findings, risk stratification, comorbidities and participant's personal goals.       Expected Outcomes Short Term: Attend rehab on a regular basis to increase amount of physical activity.;Long Term: Add in home exercise to make exercise part of routine and to increase amount of physical activity.;Long Term: Exercising regularly at least 3-5 days a week.       Increase Strength and Stamina Yes       Intervention Provide advice, education, support and counseling about physical activity/exercise needs.;Develop an individualized exercise prescription for aerobic and resistive training based on initial evaluation findings, risk stratification, comorbidities and participant's personal goals.       Expected Outcomes Short Term: Increase workloads from initial exercise  prescription for resistance, speed, and METs.;Short Term: Perform resistance training exercises routinely during rehab and add in resistance training at home;Long Term: Improve cardiorespiratory fitness, muscular endurance and strength as measured by increased METs and functional capacity (6MWT)       Able to understand and use rate of perceived exertion (RPE) scale Yes       Intervention Provide education and explanation on how to use RPE scale       Expected Outcomes Short Term: Able to use RPE daily in rehab to express subjective intensity level;Long Term:  Able to use RPE to guide intensity level when exercising independently       Knowledge and understanding of Target Heart Rate Range (THRR) Yes       Intervention Provide education and explanation of THRR including how the numbers were predicted and where they are located for reference       Expected Outcomes Short Term: Able to state/look up THRR;Short Term: Able to use daily as guideline for intensity in rehab;Long Term: Able to use THRR to govern intensity when exercising independently       Understanding of Exercise Prescription Yes       Intervention Provide education, explanation, and written materials on patient's individual exercise prescription       Expected Outcomes Short Term: Able to explain program exercise prescription;Long Term: Able to explain home exercise prescription to exercise independently  Exercise Goals Re-Evaluation:  Exercise Goals Re-Evaluation     Row [name] 03/29/21 1607 04/14/21 1624           Exercise Goal Re-Evaluation   Exercise Goals Review Increase Physical Activity;Increase Strength and Stamina;Able to understand and use rate of perceived exertion (RPE) scale;Knowledge and understanding of Target Heart Rate Range (THRR);Understanding of Exercise Prescription Increase Physical Activity;Increase Strength and Stamina;Able to understand and use rate of perceived exertion (RPE)  scale;Knowledge and understanding of Target Heart Rate Range (THRR);Able to check pulse independently;Understanding of Exercise Prescription      Comments Pt's first day in the CRP2 program. Pt understands the exercise RX, THRR, and RPE scale. Reviewed Home exercise Rx. Pt plans to walk at home 30-45 minutes 2-3 x/week.      Expected Outcomes Will continue to monitor patient and progress exercise workloads as tolerated. Pt begin to exercise at home 2-3 x/week.               Nutrition & Weight - Outcomes:  Pre Biometrics - 03/23/21 1525       Pre Biometrics   Waist Circumference 34 inches    Hip Circumference 36.25 inches    Waist to Hip Ratio 0.94 %    Triceps Skinfold 5 mm    % Body Fat 18.3 %    Grip Strength 45 kg    Flexibility 45 in    Single Leg Stand 30 seconds             Post Biometrics - 05/05/21 1626        Post  Biometrics   Height 5' 6.5" (1.689 m)    Weight 60.4 kg    Waist Circumference 33.5 inches    Hip Circumference 36 inches    Waist to Hip Ratio 0.93 %    BMI (Calculated) 21.17    Triceps Skinfold 5 mm    % Body Fat 17.9 %    Grip Strength 40 kg    Flexibility 11 in    Single Leg Stand 30 seconds             Nutrition:  Nutrition Therapy & Goals - 04/11/21 1030       Nutrition Therapy   Diet TLC    Drug/Food Interactions Statins/Certain Fruits      Personal Nutrition Goals   Nutrition Goal Pt to build a healthy plate including vegetables, fruits, whole grains, and low-fat dairy products in a heart healthy meal plan.    Personal Goal #2 Pt to choose a protein source with his lunch for increased satiety      Intervention Plan   Intervention Prescribe, educate and counsel regarding individualized specific dietary modifications aiming towards targeted core components such as weight, hypertension, lipid management, diabetes, heart failure and other comorbidities.;Nutrition handout(s) given to patient.    Expected Outcomes Long Term Goal:  Adherence to prescribed nutrition plan.;Short Term Goal: Understand basic principles of dietary content, such as calories, fat, sodium, cholesterol and nutrients.             Nutrition Discharge:   Education Questionnaire Score:  Knowledge Questionnaire Score - 05/05/21 1623       Knowledge Questionnaire Score   Post Score 21/24             Goals reviewed with patient; copy given to patient.Christopher [name] graduated from cardiac rehab program on 05/05/21  with completion of 12 exercise sessions in Phase II. Pt maintained good attendance and progressed nicely during his  participation in rehab as evidenced by increased MET level.   Medication list reconciled. Repeat  PHQ score-  0.  Pt has made significant lifestyle changes and should be commended for his success. Pt feels he has achieved his goals during cardiac rehab.   Pt plans to continue exercise by walking at the park and riding his bike. We are proud of Christopher [name] progress! Barnet Pall, RN,BSN 05/18/2021 10:59 AM

## 2021-05-05 NOTE — Progress Notes (Signed)
Entry BP 98/56 heart rate 77. BP 124/66 post 6 minute walk test max heart rate 104. Post exercise BP 88/60. Patient asymptomatic. Heart rate 83. Dr Rozanna Box had only drank 2 bottles of water today. Repeat BP 95/61 via auto cuff. Will forward BP's and exercise flow sheets to Dr Tyrell Antonio office for review.Barnet Pall, RN,BSN 05/05/2021 4:21 PM

## 2021-05-08 ENCOUNTER — Encounter (HOSPITAL_COMMUNITY): Payer: BC Managed Care – PPO

## 2021-05-10 ENCOUNTER — Encounter (HOSPITAL_COMMUNITY): Payer: BC Managed Care – PPO

## 2021-05-11 ENCOUNTER — Telehealth: Payer: Self-pay

## 2021-05-11 NOTE — Telephone Encounter (Signed)
Patient returning call.

## 2021-05-11 NOTE — Telephone Encounter (Signed)
-----   Message from Wellington Hampshire, MD sent at 05/11/2021  7:39 AM EDT ----- Regarding: RE: Low BP post exercise at cardiac rehab Thanks Verdis Frederickson.    Lattie Haw,  Please ask the patient to stop taking metoprolol and schedule an office follow-up visit with me in the next few weeks.   ----- Message ----- From: Magda Kiel, RN Sent: 05/05/2021   4:32 PM EDT To: Wellington Hampshire, MD, Lamar Laundry, RN Subject: Low BP post exercise at cardiac rehab          Good afternoon Dr Fletcher Anon,  Dr Sandria Manly BP's from cardiac rehab today are as follows.Entry BP 98/56 heart rate 77. BP 124/66 post 6 minute walk test max heart rate 104. Post exercise BP 88/60. Patient asymptomatic. Heart rate 83. Dr Rozanna Box had only drank 2 bottles of water today. Repeat BP 95/61 via auto cuff after given H20.  Dr Despina Pole completed phase 2 cardiac rehab today and has has some intermittent post  systolic exercise BP's in the 90's. I wanted to bring this to your attention.   I Will fax exercise flow sheets to your office for review.  Thanks for your assistance,  Sincerely, Barnet Pall RN Cardiac Rehab

## 2021-05-11 NOTE — Telephone Encounter (Signed)
Patient made aware of Dr. Tyrell Antonio recommendation to stop metoprolol. Appt scheduled with Dr. Fletcher Anon on 07/11/21 @ 3pm. Patient aware of the appt date and time.

## 2021-05-11 NOTE — Telephone Encounter (Signed)
Called the patient to give Dr. Tyrell Antonio recommendation. Lmtcb.

## 2021-05-12 ENCOUNTER — Encounter (HOSPITAL_COMMUNITY): Payer: BC Managed Care – PPO

## 2021-05-15 ENCOUNTER — Encounter (HOSPITAL_COMMUNITY): Payer: BC Managed Care – PPO

## 2021-05-17 ENCOUNTER — Encounter (HOSPITAL_COMMUNITY): Payer: BC Managed Care – PPO

## 2021-05-19 ENCOUNTER — Encounter (HOSPITAL_COMMUNITY): Payer: BC Managed Care – PPO

## 2021-07-10 ENCOUNTER — Other Ambulatory Visit: Payer: Self-pay | Admitting: Cardiovascular Disease

## 2021-07-11 ENCOUNTER — Ambulatory Visit: Payer: BC Managed Care – PPO | Admitting: Cardiovascular Disease

## 2021-09-19 ENCOUNTER — Telehealth: Payer: Self-pay

## 2021-09-19 MED ORDER — NITROGLYCERIN 0.4 MG SL SUBL: 0.4000 mg | SUBLINGUAL_TABLET | SUBLINGUAL | 2 refills | Status: DC | PRN

## 2021-09-19 NOTE — Telephone Encounter (Signed)
Received the following secure chat from Dr. Fletcher Anon:  Christopher [name], can you please refill his NTG ? He is out . Also we need to reschedule his follow up appointment as I had to cancel his last one due to procedures  Sent refill in to patients pharmacy, and routed to scheduling per Dr. Tyrell Antonio request.

## 2021-09-20 NOTE — Telephone Encounter (Signed)
LMOV to schedule  

## 2021-09-22 NOTE — Telephone Encounter (Signed)
LMOV to schedule  

## 2021-09-26 ENCOUNTER — Telehealth: Payer: Self-pay | Admitting: Cardiovascular Disease

## 2021-09-26 DIAGNOSIS — I25118 Atherosclerotic heart disease of native coronary artery with other forms of angina pectoris: Secondary | ICD-10-CM

## 2021-09-26 NOTE — Addendum Note (Signed)
Addended by: Lamar Laundry on: 09/26/2021 10:21 AM   Modules accepted: Orders

## 2021-09-26 NOTE — Addendum Note (Signed)
Addended by: Lamar Laundry on: 09/26/2021 09:32 AM   Modules accepted: Orders

## 2021-09-26 NOTE — Telephone Encounter (Signed)
I spoke with the patient over the phone regarding recent episodes of abdominal pain.  He has been having exertional epigastric discomfort with walking that subsequently improves if he walks through the symptoms.  His symptoms have been consistent. He is concerned that this might be gastric related issue.  However, given the exertional nature, I am concerned about atypical anginal symptoms.  Due to that, we will schedule the patient for a treadmill nuclear stress test and follow-up after.

## 2021-09-26 NOTE — Telephone Encounter (Addendum)
Order placed for exercise myoview to be performed at the [place] office. Date requested 10/03/20.  Msg fwd to scheduling to call the patient to schedule testing and a f/u appt shortly after with Dr. Fletcher Anon.    Your caregiver has ordered a Stress Test with nuclear imaging. The purpose of this test is to evaluate the blood supply to your heart muscle. This procedure is referred to as a "Non-Invasive Stress Test." This is because other than having an IV started in your vein, nothing is inserted or "invades" your body. Cardiac stress tests are done to find areas of poor blood flow to the heart by determining the extent of coronary artery disease (CAD). Some patients exercise on a treadmill, which naturally increases the blood flow to your heart, while others who are  unable to walk on a treadmill due to physical limitations have a pharmacologic/chemical stress agent called Lexiscan . This medicine will mimic walking on a treadmill by temporarily increasing your coronary blood flow.   Please note: these test may take anywhere between 2-4 hours to complete  Date of Procedure:_____________________________________  Arrival Time for Procedure:______________________________   How to prepare for your Myoview test:  Do not eat or drink after midnight No caffeine for 24 hours prior to test No smoking 24 hours prior to test. Your medication may be taken with water.  If your doctor stopped a medication because of this test, do not take that medication. Please wear a short sleeve shirt. No cologne or lotion. Wear comfortable walking shoes.

## 2021-09-26 NOTE — Telephone Encounter (Signed)
Christopher Hampshire, MD  You 10 minutes ago (9:12 AM)   Please schedule the patient for a treadmill nuclear stress test to be done on January 10 in our [place] office.  I confirmed that the date works for him.  Schedule a follow-up visit after his stress test. Thanks

## 2021-09-27 ENCOUNTER — Telehealth (HOSPITAL_COMMUNITY): Payer: Self-pay | Admitting: *Deleted

## 2021-09-27 ENCOUNTER — Other Ambulatory Visit: Payer: Self-pay | Admitting: Cardiovascular Disease

## 2021-09-27 NOTE — Telephone Encounter (Signed)
Patient was scheduled for 10/19/20. Closing encounter.

## 2021-09-27 NOTE — Addendum Note (Signed)
Addended by: Kathlyn Sacramento A on: 09/27/2021 03:03 PM   Modules accepted: Orders

## 2021-09-27 NOTE — Telephone Encounter (Signed)
Patient given detailed instructions per Myocardial Perfusion Study Information Sheet for the test on 10/03/21 at 0800. Patient notified to arrive 15 minutes early and that it is imperative to arrive on time for appointment to keep from having the test rescheduled.  If you need to cancel or reschedule your appointment, please call the office within 24 hours of your appointment. . Patient verbalized understanding.Christopher [name], Ranae Palms

## 2021-10-03 ENCOUNTER — Ambulatory Visit (HOSPITAL_COMMUNITY): Payer: BC Managed Care – PPO | Attending: Internal Medicine

## 2021-10-03 ENCOUNTER — Other Ambulatory Visit: Payer: Self-pay

## 2021-10-03 DIAGNOSIS — I25118 Atherosclerotic heart disease of native coronary artery with other forms of angina pectoris: Secondary | ICD-10-CM

## 2021-10-03 MED ORDER — TECHNETIUM TC 99M TETROFOSMIN IV KIT
31.1000 | PACK | Freq: Once | INTRAVENOUS | Status: AC | PRN
  Administered 2021-10-03: 31.1 via INTRAVENOUS
  Filled 2021-10-03: qty 32

## 2021-10-03 MED ORDER — TECHNETIUM TC 99M TETROFOSMIN IV KIT
9.8000 | PACK | Freq: Once | INTRAVENOUS | Status: AC | PRN
  Administered 2021-10-03: 9.8 via INTRAVENOUS
  Filled 2021-10-03: qty 10

## 2021-10-04 ENCOUNTER — Other Ambulatory Visit: Payer: Self-pay | Admitting: Cardiovascular Disease

## 2021-10-04 LAB — MYOCARDIAL PERFUSION IMAGING
Angina Index: 0
Estimated workload: 9.4
Exercise duration (min): 7 min
Exercise duration (sec): 34 s
LV dias vol: 67 mL (ref 62–150)
LV sys vol: 31 mL
MPHR: 153 {beats}/min
Nuc Stress EF: 54 %
Peak HR: 153 {beats}/min
Percent HR: 100 %
Rest HR: 59 {beats}/min
Rest Nuclear Isotope Dose: 9.8 mCi
SDS: 0
SRS: 4
SSS: 5
Stress Nuclear Isotope Dose: 31.1 mCi
TID: 1.07

## 2021-10-04 MED ORDER — RANOLAZINE ER 500 MG PO TB12: 500.0000 mg | ORAL_TABLET | Freq: Two times a day (BID) | ORAL | 3 refills | Status: DC

## 2021-10-04 NOTE — Progress Notes (Signed)
I called the patient and discussed the results of nuclear stress test.  He had rate dependent left bundle branch block and had reproducible abdominal pain with exercise.  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  I suspect that the patient is having angina.  We will start him on Ranexa 500 mg twice daily.  He has a follow-up appointment with me on the 26 and if he is still having symptoms, the plan is to proceed with repeat cardiac catheterization.

## 2021-10-19 ENCOUNTER — Encounter: Payer: Self-pay | Admitting: Cardiovascular Disease

## 2021-10-19 ENCOUNTER — Other Ambulatory Visit: Payer: Self-pay

## 2021-10-19 ENCOUNTER — Ambulatory Visit: Payer: BC Managed Care – PPO | Admitting: Cardiovascular Disease

## 2021-10-19 VITALS — BP 110/70 | HR 79 | Ht 68.0 in | Wt 136.1 lb

## 2021-10-19 DIAGNOSIS — E785 Hyperlipidemia, unspecified: Secondary | ICD-10-CM | POA: Diagnosis not present

## 2021-10-19 DIAGNOSIS — K219 Gastro-esophageal reflux disease without esophagitis: Secondary | ICD-10-CM | POA: Diagnosis not present

## 2021-10-19 DIAGNOSIS — I25118 Atherosclerotic heart disease of native coronary artery with other forms of angina pectoris: Secondary | ICD-10-CM | POA: Diagnosis not present

## 2021-10-19 MED ORDER — ROSUVASTATIN CALCIUM 20 MG PO TABS: 20.0000 mg | ORAL_TABLET | Freq: Every day | ORAL | 2 refills | Status: DC

## 2021-10-19 MED ORDER — RANOLAZINE ER 1000 MG PO TB12: 1000.0000 mg | ORAL_TABLET | Freq: Two times a day (BID) | ORAL | 1 refills | Status: DC

## 2021-10-19 NOTE — Progress Notes (Signed)
Cardiology Office Note   Date:  10/19/2021   ID:  Christopher [name], DOB 27-Nov-1955, MRN [id number]  PCP:  Wenda Low, MD  Cardiologist:   Kathlyn Sacramento, MD   Chief Complaint  Patient presents with   Other    OD 4 wk f/u no complaints today. Meds reviewed verbally with pt.      History of Present Illness: Christopher [name] is a 66 y.o. male who presents for follow-up visit regarding coronary artery disease.  He has known history of GERD and Raynaud's disease.  He is not a smoker.  He does have family history of premature coronary artery disease. He works as a Network engineer at [employer]. He had previous lower extremity arterial Doppler in 2018 that was normal. He was seen in early 2022 for exertional burning sensation in the chest and epigastric area radiating to the neck.  Cardiac CTA was done which showed a calcium score of 767 with evidence of severe stenosis in the LAD and moderate stenosis in first diagonal, ramus, left circumflex and OM. Cardiac catheterization was done in February 2022 which showed left dominant coronary arteries with significant three-vessel coronary artery disease.  The culprit felt to be heavily calcified severe mid LAD stenosis.  In addition, there was significant stenosis in the distal LAD.  The RCA was small nondominant but occluded with left-to-right collaterals.  The left circumflex had 80% stenosis.  I performed successful angioplasty and drug-eluting stent placement to the mid LAD.  The procedure was very difficult due to calcifications.  There was 15% residual stenosis in the proximal segment of the stent in spite of high pressure balloon.  2 small diagonals were jailed by the stent with sluggish flow. He underwent staged drug-eluting stent placement to the mid left circumflex in March 2022.  He attended cardiac rehab last year and did very well overall.  However, he continues to have exertional abdominal pain that usually improves as he continues with  his activities.  Due to that, I referred him for a treadmill Myoview.  He exercised for 7-1/2 minutes and achieved a maximum heart rate 153 bpm.  He had reproducible abdominal pain and developed rate dependent left bundle branch block.  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  Ejection fraction was 54%. Based on this, I started him on Ranexa 500 mg twice daily and he reports some improvement in symptoms.  I again reviewed images of previous cardiac catheterization with him.  He has multiple areas of small vessel disease including subtotal occlusion of a nondominant right coronary artery and significant apical LAD disease as well as diagonal and OM disease.  Past Medical History:  Diagnosis Date   Anxiety    Cervical disc disease    Coronary artery disease    GERD (gastroesophageal reflux disease)    triggers by eating spicy food per pt   Raynaud's disease    Vitamin B 12 deficiency    Vitamin D deficiency     Past Surgical History:  Procedure Laterality Date   CARDIAC CATHETERIZATION     COLONOSCOPY  2011   Johnson-normal exam per pt   CORONARY STENT INTERVENTION  11/18/2020   CORONARY STENT INTERVENTION N/A 11/18/2020   Procedure: CORONARY STENT INTERVENTION;  Surgeon: Wellington Hampshire, MD;  Location: [facility];  Service: Cardiovascular;  Laterality: N/A;   CORONARY STENT INTERVENTION N/A 12/16/2020   Procedure: CORONARY STENT INTERVENTION;  Surgeon: Wellington Hampshire,  MD;  Location: [facility];  Service: Cardiovascular;  Laterality: N/A;   infertility surgery     INTRAVASCULAR IMAGING/OCT N/A 11/18/2020   Procedure: INTRAVASCULAR IMAGING/OCT;  Surgeon: Wellington Hampshire, MD;  Location: [facility];  Service: Cardiovascular;  Laterality: N/A;   INTRAVASCULAR IMAGING/OCT N/A 12/16/2020   Procedure: INTRAVASCULAR IMAGING/OCT;  Surgeon: Wellington Hampshire, MD;  Location: [facility];  Service:  Cardiovascular;  Laterality: N/A;   LEFT HEART CATH AND CORONARY ANGIOGRAPHY N/A 11/18/2020   Procedure: LEFT HEART CATH AND CORONARY ANGIOGRAPHY;  Surgeon: Wellington Hampshire, MD;  Location: [facility];  Service: Cardiovascular;  Laterality: N/A;   NECK SURGERY  2007   UPPER GASTROINTESTINAL ENDOSCOPY  2020   Ganem-h.pylori per pt     Current Outpatient Medications  Medication Sig Dispense Refill   aspirin EC 81 MG tablet Take 1 tablet (81 mg total) by mouth daily. Swallow whole. 90 tablet 3   clopidogrel (PLAVIX) 75 MG tablet Take 2 tablets (150 mg) the first day. Starting the next day take 1 tablet (75 mg) daily. 90 tablet 1   nitroGLYCERIN (NITROSTAT) 0.4 MG SL tablet Place 1 tablet (0.4 mg total) under the tongue every 5 (five) minutes as needed for chest pain. 25 tablet 2   pantoprazole (PROTONIX) 40 MG tablet Take 40 mg by mouth daily.     rosuvastatin (CRESTOR) 10 MG tablet Take 1 tablet (10 mg total) by mouth daily. 90 tablet 1   Cholecalciferol (VITAMIN D3) 50 MCG (2000 UT) TABS Take 2,000 Units by mouth. (Patient not taking: Reported on 10/19/2021)     ranolazine (RANEXA) 500 MG 12 hr tablet Take 1 tablet (500 mg total) by mouth 2 (two) times daily. (Patient not taking: Reported on 10/19/2021) 60 tablet 3   No current facility-administered medications for this visit.    Allergies:   Azithromycin    Social History:  The patient  reports that he has never smoked. He has never used smokeless tobacco. He reports that he does not currently use alcohol. He reports that he does not currently use drugs.   Family History:  The patient's family history includes CAD in his father; Diabetes in his mother; Heart disease (age of onset: 75) in his mother; Hyperlipidemia in his mother; Hypertension in his mother; Stroke in his father.    ROS:  Please see the history of present illness.   Otherwise, review of systems are positive for none.   All other systems are reviewed and negative.     PHYSICAL EXAM: VS:  BP 110/70 (BP Location: Left Arm, Patient Position: Sitting, Cuff Size: Normal)    Pulse 79    Ht 5\' 8"  (1.727 m)    Wt 136 lb 2 oz (61.7 kg)    SpO2 99%    BMI 20.70 kg/m  , BMI Body mass index is 20.7 kg/m. GEN: Well nourished, well developed, in no acute distress  HEENT: normal  Neck: no JVD, carotid bruits, or masses Cardiac: RRR; no murmurs, rubs, or gallops,no edema  Respiratory:  clear to auscultation bilaterally, normal work of breathing GI: soft, nontender, nondistended, + BS MS: no deformity or atrophy  Skin: warm and dry, no rash Neuro:  Strength and sensation are intact Psych: euthymic mood, full affect   EKG:  EKG is ordered today. The ekg ordered today demonstrates normal sinus rhythm with no significant ST or T wave changes.   Recent Labs: 11/19/2020: ALT 11 12/08/2020: Hemoglobin  12.7; Platelets 179 12/16/2020: BUN 15; Creatinine, Ser 1.06; Potassium 4.2; Sodium 134    Lipid Panel    Component Value Date/Time   CHOL 122 11/19/2020 0236   TRIG 85 11/19/2020 0236   HDL 32 (L) 11/19/2020 0236   CHOLHDL 3.8 11/19/2020 0236   VLDL 17 11/19/2020 0236   LDLCALC 73 11/19/2020 0236      Wt Readings from Last 3 Encounters:  10/19/21 136 lb 2 oz (61.7 kg)  10/03/21 133 lb (60.3 kg)  05/05/21 133 lb 2.5 oz (60.4 kg)        PAD Screen 10/25/2020  Previous PAD dx? No  Previous surgical procedure? No  Pain with walking? No  Feet/toe relief with dangling? No  Painful, non-healing ulcers? No  Extremities discolored? Yes      ASSESSMENT AND PLAN:  1.  Coronary artery disease involving native coronary arteries with stable angina: The exertional component of his abdominal pain is likely atypical angina.  I recommend maximizing his antianginal therapy.  I increase Ranexa to 1000 mg twice daily.  I discussed with him the importance of starting an exercise program.  He used to ride a bike in the past and hopefully he can get back into that.    If symptoms persist, the next step is to repeat his cardiac catheterization.  2.  Hyperlipidemia: He is more consistent taking rosuvastatin regularly aggressive in getting his LDL down.  I increased rosuvastatin to 20 mg daily.  No side effects with statin therapy so far.  3.  GERD: He follows with GI.    Disposition:   Follow-up with me in 6 months.  Signed,  Kathlyn Sacramento, MD  10/19/2021 11:51 AM    [place]

## 2021-10-19 NOTE — Patient Instructions (Signed)
Medication Instructions:  Your physician has recommended you make the following change in your medication:   INCREASE Ranexa to 1000 mg twice a day. An Rx has been sent to your pharmacy  INCREASE Crestor to 20 mg daily. An Rx has been sent to your pharmacy    *If you need a refill on your cardiac medications before your next appointment, please call your pharmacy*   Lab Work: None ordered  If you have labs (blood work) drawn today and your tests are completely normal, you will receive your results only by: [place] (if you have MyChart) OR A paper copy in the mail If you have any lab test that is abnormal or we need to change your treatment, we will call you to review the results.   Testing/Procedures: None odered   Follow-Up: At [facility], you and your health needs are our priority.  As part of our continuing mission to provide you with exceptional heart care, we have created designated Provider Care Teams.  These Care Teams include your primary Cardiologist (physician) and Advanced Practice Providers (APPs -  Physician Assistants and Nurse Practitioners) who all work together to provide you with the care you need, when you need it.  We recommend signing up for the patient portal called "MyChart".  Sign up information is provided on this After Visit Summary.  MyChart is used to connect with patients for Virtual Visits (Telemedicine).  Patients are able to view lab/test results, encounter notes, upcoming appointments, etc.  Non-urgent messages can be sent to your provider as well.   To learn more about what you can do with MyChart, go to [email or web address].    Your next appointment:   Your physician wants you to follow-up in: 6 months You will receive a reminder letter in the mail two months in advance. If you don't receive a letter, please call our office to schedule the follow-up appointment.   The format for your next appointment:   In Person  Provider:    You may see Kathlyn Sacramento, MD or one of the following Advanced Practice Providers on your designated Care Team:   Murray Hodgkins, NP Christell Faith, PA-C Cadence Kathlen Mody, PA-C{    Other Instructions N/A

## 2022-02-16 ENCOUNTER — Other Ambulatory Visit: Payer: Self-pay | Admitting: Cardiovascular Disease

## 2022-03-02 ENCOUNTER — Other Ambulatory Visit: Payer: Self-pay | Admitting: Cardiovascular Disease

## 2022-03-02 NOTE — Telephone Encounter (Signed)
Please schedule overdue 6 month F/U for refills. Thank you!

## 2022-03-05 NOTE — Telephone Encounter (Signed)
LVM TO SCHEDULE

## 2022-06-06 ENCOUNTER — Other Ambulatory Visit: Payer: Self-pay | Admitting: Cardiovascular Disease

## 2022-06-06 NOTE — Telephone Encounter (Signed)
Please contact pt for future appointment. Pt overdue for 6 month f/u. Pt needing refills. 

## 2022-06-22 ENCOUNTER — Other Ambulatory Visit: Payer: Self-pay | Admitting: Cardiovascular Disease

## 2022-06-22 MED ORDER — PAXLOVID (300/100) 20 X 150 MG & 10 X 100MG PO TBPK: 1.0000 | ORAL_TABLET | Freq: Two times a day (BID) | ORAL | 0 refills | Status: AC

## 2022-06-25 ENCOUNTER — Other Ambulatory Visit: Payer: Self-pay | Admitting: Cardiovascular Disease

## 2022-06-25 MED ORDER — BENZONATATE 200 MG PO CAPS: 200.0000 mg | ORAL_CAPSULE | Freq: Three times a day (TID) | ORAL | 0 refills | Status: DC | PRN

## 2022-06-25 MED ORDER — METHYLPREDNISOLONE 4 MG PO TBPK: ORAL_TABLET | ORAL | 0 refills | Status: AC

## 2022-07-20 ENCOUNTER — Other Ambulatory Visit: Payer: Self-pay | Admitting: Cardiovascular Disease

## 2022-08-21 ENCOUNTER — Encounter: Payer: Self-pay | Admitting: Cardiovascular Disease

## 2022-08-21 ENCOUNTER — Ambulatory Visit: Payer: BC Managed Care – PPO | Attending: Cardiovascular Disease | Admitting: Cardiovascular Disease

## 2022-08-21 VITALS — BP 120/80 | HR 84 | Ht 68.0 in | Wt 140.0 lb

## 2022-08-21 DIAGNOSIS — E785 Hyperlipidemia, unspecified: Secondary | ICD-10-CM

## 2022-08-21 DIAGNOSIS — I25118 Atherosclerotic heart disease of native coronary artery with other forms of angina pectoris: Secondary | ICD-10-CM

## 2022-08-21 MED ORDER — CLOPIDOGREL BISULFATE 75 MG PO TABS: 75.0000 mg | ORAL_TABLET | Freq: Every day | ORAL | 3 refills | Status: DC

## 2022-08-21 MED ORDER — RANOLAZINE ER 1000 MG PO TB12: 1000.0000 mg | ORAL_TABLET | Freq: Two times a day (BID) | ORAL | 3 refills | Status: DC

## 2022-08-21 MED ORDER — ROSUVASTATIN CALCIUM 20 MG PO TABS: 20.0000 mg | ORAL_TABLET | Freq: Every day | ORAL | 3 refills | Status: DC

## 2022-08-21 NOTE — Patient Instructions (Signed)
Medication Instructions:  No changes *If you need a refill on your cardiac medications before your next appointment, please call your pharmacy*   Lab Work: None ordered If you have labs (blood work) drawn today and your tests are completely normal, you will receive your results only by: [place] (if you have MyChart) OR A paper copy in the mail If you have any lab test that is abnormal or we need to change your treatment, we will call you to review the results.   Testing/Procedures: None ordered   Follow-Up: At [facility], you and your health needs are our priority.  As part of our continuing mission to provide you with exceptional heart care, we have created designated Provider Care Teams.  These Care Teams include your primary Cardiologist (physician) and Advanced Practice Providers (APPs -  Physician Assistants and Nurse Practitioners) who all work together to provide you with the care you need, when you need it.  We recommend signing up for the patient portal called "MyChart".  Sign up information is provided on this After Visit Summary.  MyChart is used to connect with patients for Virtual Visits (Telemedicine).  Patients are able to view lab/test results, encounter notes, upcoming appointments, etc.  Non-urgent messages can be sent to your provider as well.   To learn more about what you can do with MyChart, go to [email or web address].    Your next appointment:   12 month(s)  The format for your next appointment:   In Person  Provider:   You may see Kathlyn Sacramento, MD or one of the following Advanced Practice Providers on your designated Care Team:   Murray Hodgkins, NP Christell Faith, PA-C Cadence Kathlen Mody, PA-C Gerrie Nordmann, NP     Important Information About [place]

## 2022-08-21 NOTE — Progress Notes (Signed)
Cardiology Office Note   Date:  08/21/2022   ID:  Christopher [name] [birth year], MRN [id number]  PCP:  Chesley Noon, MD  Cardiologist:   Kathlyn Sacramento, MD   Chief Complaint  Patient presents with   Other    6 Month f/u no complaints today. Meds reviewed verbally with pt.      History of Present Illness: Christopher [name] is a 66 y.o. male who presents for follow-up visit regarding coronary artery disease.  He has known history of GERD and Raynaud's disease.  He is not a smoker.  He does have family history of premature coronary artery disease. He works as a Network engineer at [employer]. He had previous lower extremity arterial Doppler in 2018 that was normal. He was seen in early 2022 for exertional burning sensation in the chest and epigastric area radiating to the neck.  Cardiac CTA was done which showed a calcium score of 767 with evidence of severe stenosis in the LAD and moderate stenosis in first diagonal, ramus, left circumflex and OM. Cardiac catheterization was done in February 2022 which showed left dominant coronary arteries with significant three-vessel coronary artery disease.  The culprit felt to be heavily calcified severe mid LAD stenosis.  In addition, there was significant stenosis in the distal LAD.  The RCA was small nondominant but occluded with left-to-right collaterals.  The left circumflex had 80% stenosis.  I performed successful angioplasty and drug-eluting stent placement to the mid LAD.  The procedure was very difficult due to calcifications.  There was 15% residual stenosis in the proximal segment of the stent in spite of high pressure balloon.  2 small diagonals were jailed by the stent with sluggish flow. He underwent staged drug-eluting stent placement to the mid left circumflex in March 2022.  He attended cardiac rehab post PCI and did very well overall.  However, he continued to have exertional abdominal pain that usually improves as he continues with  his activities.  Due to that, I referred him for a treadmill Myoview.  He exercised for 7-1/2 minutes and achieved a maximum heart rate 153 bpm.  He had reproducible abdominal pain and developed rate dependent left bundle branch block.  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  Ejection fraction was 54%. He was started on Ranexa which was subsequently increased to 1000 mg twice daily and felt significantly better with almost complete resolution of symptoms.  His previous cardiac catheterization did show multiple areas of small vessel disease including subtotal occlusion of a nondominant right coronary artery and significant apical LAD disease as well as diagonal and OM disease. He had COVID-19 recently and had lingering cough that subsequently improved with Pulmicort inhaler.  He reports stable symptoms overall and is planning to start exercising on a stationary bike.  No shortness of breath.  He has minimal abdominal pain with exertion.  Past Medical History:  Diagnosis Date   Anxiety    Cervical disc disease    Coronary artery disease    GERD (gastroesophageal reflux disease)    triggers by eating spicy food per pt   Raynaud's disease    Vitamin B 12 deficiency    Vitamin D deficiency     Past Surgical History:  Procedure Laterality Date   CARDIAC CATHETERIZATION     COLONOSCOPY  2011   Johnson-normal exam per pt   CORONARY STENT INTERVENTION  11/18/2020   CORONARY STENT INTERVENTION N/A 11/18/2020  Procedure: CORONARY STENT INTERVENTION;  Surgeon: Wellington Hampshire, MD;  Location: [facility];  Service: Cardiovascular;  Laterality: N/A;   CORONARY STENT INTERVENTION N/A 12/16/2020   Procedure: CORONARY STENT INTERVENTION;  Surgeon: Wellington Hampshire, MD;  Location: [facility];  Service: Cardiovascular;  Laterality: N/A;   infertility surgery     INTRAVASCULAR IMAGING/OCT N/A 11/18/2020   Procedure: INTRAVASCULAR  IMAGING/OCT;  Surgeon: Wellington Hampshire, MD;  Location: [facility];  Service: Cardiovascular;  Laterality: N/A;   INTRAVASCULAR IMAGING/OCT N/A 12/16/2020   Procedure: INTRAVASCULAR IMAGING/OCT;  Surgeon: Wellington Hampshire, MD;  Location: [facility];  Service: Cardiovascular;  Laterality: N/A;   LEFT HEART CATH AND CORONARY ANGIOGRAPHY N/A 11/18/2020   Procedure: LEFT HEART CATH AND CORONARY ANGIOGRAPHY;  Surgeon: Wellington Hampshire, MD;  Location: [facility];  Service: Cardiovascular;  Laterality: N/A;   NECK SURGERY  2007   UPPER GASTROINTESTINAL ENDOSCOPY  2020   Ganem-h.pylori per pt     Current Outpatient Medications  Medication Sig Dispense Refill   clopidogrel (PLAVIX) 75 MG tablet Take 1 tablet (75 mg total) by mouth daily. Please schedule appt for future refills. 1st attempt 90 tablet 0   nitroGLYCERIN (NITROSTAT) 0.4 MG SL tablet Place 1 tablet (0.4 mg total) under the tongue every 5 (five) minutes as needed for chest pain. 25 tablet 2   pantoprazole (PROTONIX) 40 MG tablet Take 40 mg by mouth daily.     ranolazine (RANEXA) 1000 MG SR tablet Take 1 tablet (1,000 mg total) by mouth 2 (two) times daily. PLEASE SCHEDULE OFFICE VISIT FOR FURTHER REFILLS. THANK YOU! 60 tablet 0   rosuvastatin (CRESTOR) 20 MG tablet Take 1 tablet (20 mg total) by mouth daily. 90 tablet 2   aspirin EC 81 MG tablet Take 1 tablet (81 mg total) by mouth daily. Swallow whole. (Patient not taking: Reported on 08/21/2022) 90 tablet 3   No current facility-administered medications for this visit.    Allergies:   Azithromycin    Social History:  The patient  reports that he has never smoked. He has never used smokeless tobacco. He reports that he does not currently use alcohol. He reports that he does not currently use drugs.   Family History:  The patient's family history includes CAD in his father; Diabetes in his mother; Heart disease (age of onset: 17) in his mother; Hyperlipidemia  in his mother; Hypertension in his mother; Stroke in his father.    ROS:  Please see the history of present illness.   Otherwise, review of systems are positive for none.   All other systems are reviewed and negative.    PHYSICAL EXAM: VS:  BP 120/80 (BP Location: Left Arm, Patient Position: Sitting, Cuff Size: Normal)   Pulse 84   Ht '5\' 8"'$  (1.727 m)   Wt 140 lb (63.5 kg)   SpO2 99%   BMI 21.29 kg/m  , BMI Body mass index is 21.29 kg/m. GEN: Well nourished, well developed, in no acute distress  HEENT: normal  Neck: no JVD, carotid bruits, or masses Cardiac: RRR; no murmurs, rubs, or gallops,no edema  Respiratory:  clear to auscultation bilaterally, normal work of breathing GI: soft, nontender, nondistended, + BS MS: no deformity or atrophy  Skin: warm and dry, no rash Neuro:  Strength and sensation are intact Psych: euthymic mood, full affect   EKG:  EKG is ordered today. The ekg ordered today demonstrates normal sinus rhythm  with no significant ST or T wave changes.   Recent Labs: No results found for requested labs within last 365 days.    Lipid Panel    Component Value Date/Time   CHOL 122 11/19/2020 0236   TRIG 85 11/19/2020 0236   HDL 32 (L) 11/19/2020 0236   CHOLHDL 3.8 11/19/2020 0236   VLDL 17 11/19/2020 0236   LDLCALC 73 11/19/2020 0236      Wt Readings from Last 3 Encounters:  08/21/22 140 lb (63.5 kg)  10/19/21 136 lb 2 oz (61.7 kg)  10/03/21 133 lb (60.3 kg)           10/25/2020    4:44 PM  PAD Screen  Previous PAD dx? No  Previous surgical procedure? No  Pain with walking? No  Feet/toe relief with dangling? No  Painful, non-healing ulcers? No  Extremities discolored? Yes      ASSESSMENT AND PLAN:  1.  Coronary artery disease involving native coronary arteries with stable angina: The exertional component of his abdominal pain is likely atypical angina.  He has been taking Ranexa 1000 mg once daily and I advised him to increase that to  1000 mg twice daily especially with the significant improvement in his anginal symptoms with this.   Continue clopidogrel without aspirin at this time.  2.  Hyperlipidemia: He is more consistent taking rosuvastatin regularly aggressive in getting his LDL down.  Rosuvastatin was increased last year.  He is going for a physical with his primary care physician next month.  I discussed with him the need to be extremely aggressive lowering his LDL considering degree of calcified coronary arteries and diffuse disease.  3.  GERD: He follows with GI.    Disposition:   Follow-up with me in 12 months.  Signed,  Kathlyn Sacramento, MD  08/21/2022 3:18 PM    [place]

## 2022-11-26 ENCOUNTER — Other Ambulatory Visit: Payer: Self-pay | Admitting: Cardiovascular Disease

## 2023-02-20 ENCOUNTER — Other Ambulatory Visit: Payer: Self-pay | Admitting: Cardiovascular Disease

## 2023-02-20 MED ORDER — ALPRAZOLAM 0.25 MG PO TABS: 0.2500 mg | ORAL_TABLET | Freq: Every evening | ORAL | 5 refills | Status: DC | PRN

## 2023-03-22 ENCOUNTER — Ambulatory Visit: Payer: BC Managed Care – PPO | Attending: Cardiovascular Disease | Admitting: Cardiovascular Disease

## 2023-03-22 ENCOUNTER — Encounter: Payer: Self-pay | Admitting: Cardiovascular Disease

## 2023-03-22 VITALS — BP 110/70 | HR 80 | Ht 68.0 in | Wt 130.5 lb

## 2023-03-22 DIAGNOSIS — E785 Hyperlipidemia, unspecified: Secondary | ICD-10-CM

## 2023-03-22 DIAGNOSIS — I25118 Atherosclerotic heart disease of native coronary artery with other forms of angina pectoris: Secondary | ICD-10-CM | POA: Diagnosis not present

## 2023-03-22 MED ORDER — NITROGLYCERIN 0.4 MG SL SUBL: SUBLINGUAL_TABLET | SUBLINGUAL | 2 refills | Status: AC

## 2023-03-22 NOTE — Patient Instructions (Signed)
Medication Instructions:  No changes *If you need a refill on your cardiac medications before your next appointment, please call your pharmacy*   Lab Work: None ordered If you have labs (blood work) drawn today and your tests are completely normal, you will receive your results only by: MyChart Message (if you have MyChart) OR A paper copy in the mail If you have any lab test that is abnormal or we need to change your treatment, we will call you to review the results.   Testing/Procedures: None ordered   Follow-Up: At [facility], you and your health needs are our priority.  As part of our continuing mission to provide you with exceptional heart care, we have created designated Provider Care Teams.  These Care Teams include your primary Cardiologist (physician) and Advanced Practice Providers (APPs -  Physician Assistants and Nurse Practitioners) who all work together to provide you with the care you need, when you need it.  We recommend signing up for the patient portal called "MyChart".  Sign up information is provided on this After Visit Summary.  MyChart is used to connect with patients for Virtual Visits (Telemedicine).  Patients are able to view lab/test results, encounter notes, upcoming appointments, etc.  Non-urgent messages can be sent to your provider as well.   To learn more about what you can do with MyChart, go to [email or web address].    Your next appointment:   6 month(s)  Provider:   Muhammad Arida, MD   

## 2023-03-22 NOTE — Progress Notes (Unsigned)
Cardiology Office Note   Date:  03/22/2023   ID:  Christopher [name], Christopher [name] 13-Aug-1956, MRN [id number]  PCP:  Jackelyn Poling, DO  Cardiologist:   Lorine Bears, MD   Chief Complaint  Patient presents with   Follow-up    Per Kirke Corin c/o under a lot of stress concerned about his heart health. Meds reviewed verbally with pt.      History of Present Illness: Christopher [name] is a 68 y.o. male who presents for follow-up visit regarding coronary artery disease.  He has known history of GERD and Raynaud's disease.  He is not a smoker.  He does have family history of premature coronary artery disease. He works as a Airline pilot at [employer]. He had previous lower extremity arterial Doppler in 2018 that was normal. He was seen in early 2022 for exertional burning sensation in the chest and epigastric area radiating to the neck.  Cardiac CTA was done which showed a calcium score of 767 with evidence of severe stenosis in the LAD and moderate stenosis in first diagonal, ramus, left circumflex and OM. Cardiac catheterization was done in February 2022 which showed left dominant coronary arteries with significant three-vessel coronary artery disease.  The culprit felt to be heavily calcified severe mid LAD stenosis.  In addition, there was significant stenosis in the distal LAD.  The RCA was small nondominant but occluded with left-to-right collaterals.  The left circumflex had 80% stenosis.  I performed successful angioplasty and drug-eluting stent placement to the mid LAD.  The procedure was very difficult due to calcifications.  There was 15% residual stenosis in the proximal segment of the stent in spite of high pressure balloon.  2 small diagonals were jailed by the stent with sluggish flow. He underwent staged drug-eluting stent placement to the mid left circumflex in March 2022.  He has small vessel disease that responded to Ranexa. Over the last 2 to 3 months, he struggled with increased symptoms of  anxiety and insomnia.  I provided him with alprazolam to be used as needed.  It does help to get him back to sleep but he wakes up sluggish and not feeling well.  He usually wakes up after 2 hours of going to sleep and then he cannot go back. He does have increased episodes of abdominal and chest pain when it is hot but he has been able to do his morning walks with no issues.  He does complain of heaviness in both legs with exertion but his pulses today are normal.   Past Medical History:  Diagnosis Date   Anxiety    Cervical disc disease    Coronary artery disease    GERD (gastroesophageal reflux disease)    triggers by eating spicy food per pt   Raynaud's disease    Vitamin B 12 deficiency    Vitamin D deficiency     Past Surgical History:  Procedure Laterality Date   CARDIAC CATHETERIZATION     COLONOSCOPY  2011   Johnson-normal exam per pt   CORONARY IMAGING/OCT N/A 11/18/2020   Procedure: INTRAVASCULAR IMAGING/OCT;  Surgeon: Iran Ouch, MD;  Location: [facility]ASIVE [facility];  Service: Cardiovascular;  Laterality: N/A;   CORONARY IMAGING/OCT N/A 12/16/2020   Procedure: INTRAVASCULAR IMAGING/OCT;  Surgeon: Iran Ouch, MD;  Location: [facility];  Service: Cardiovascular;  Laterality: N/A;   CORONARY STENT INTERVENTION  11/18/2020   CORONARY STENT INTERVENTION N/A 11/18/2020   Procedure: CORONARY STENT INTERVENTION;  Surgeon: Iran Ouch, MD;  Location: [facility] INVASIVE CV LAB;  Service: Cardiovascular;  Laterality: N/A;   CORONARY STENT INTERVENTION N/A 12/16/2020   Procedure: CORONARY STENT INTERVENTION;  Surgeon: Iran Ouch, MD;  Location: [facility] INVASIVE CV [facility];  Service: Cardiovascular;  Laterality: N/A;   infertility surgery     LEFT HEART CATH AND CORONARY ANGIOGRAPHY N/A 11/18/2020   Procedure: LEFT HEART CATH AND CORONARY ANGIOGRAPHY;  Surgeon: Iran Ouch, MD;  Location: [facility]AS[facility] CV [facility];  Service: Cardiovascular;  Laterality: N/A;   NECK  SURGERY  2007   UPPER GASTROINTESTINAL ENDOSCOPY  2020   Ganem-h.pylori per pt     Current Outpatient Medications  Medication Sig Dispense Refill   ALPRAZolam (XANAX) 0.25 MG tablet Take 1 tablet (0.25 mg total) by mouth at bedtime as needed for anxiety. 30 tablet 5   clopidogrel (PLAVIX) 75 MG tablet Take 1 tablet (75 mg total) by mouth daily. 90 tablet 3   nitroGLYCERIN (NITROSTAT) 0.4 MG SL tablet DISSOLVE ONE TABLET UNDER TONGUE EVERY 5 MINUTES AS NEEDED FOR CHEST PAIN 25 tablet 2   ranolazine (RANEXA) 1000 MG SR tablet Take 1 tablet (1,000 mg total) by mouth 2 (two) times daily. 180 tablet 3   rosuvastatin (CRESTOR) 20 MG tablet Take 1 tablet (20 mg total) by mouth daily. 90 tablet 3   No current facility-administered medications for this visit.    Allergies:   Azithromycin    Social History:  The patient  reports that he has never smoked. He has never used smokeless tobacco. He reports that he does not currently use alcohol. He reports that he does not currently use drugs.   Family History:  The patient's family history includes CAD in his father; Diabetes in his mother; Heart disease (age of onset: 55) in his mother; Hyperlipidemia in his mother; Hypertension in his mother; Stroke in his father.    ROS:  Please see the history of present illness.   Otherwise, review of systems are positive for none.   All other systems are reviewed and negative.    PHYSICAL EXAM: VS:  BP 110/70 (BP Location: Left Arm, Patient Position: Sitting, Cuff Size: Normal)   Pulse 80   Ht 5\' 8"  (1.727 m)   Wt 130 lb 8 oz (59.2 kg)   SpO2 98%   BMI 19.84 kg/m  , BMI Body mass index is 19.84 kg/m. GEN: Well nourished, well developed, in no acute distress  HEENT: normal  Neck: no JVD, carotid bruits, or masses Cardiac: RRR; no murmurs, rubs, or gallops,no edema  Respiratory:  clear to auscultation bilaterally, normal work of breathing GI: soft, nontender, nondistended, + BS MS: no deformity or  atrophy  Skin: warm and dry, no rash Neuro:  Strength and sensation are intact Psych: euthymic mood, full affect Vascular: Femoral pulses are normal bilaterally.distal pulses are palpable.   EKG:  EKG is ordered today. The ekg ordered today demonstrates normal sinus rhythm with no significant ST or T wave changes.   Recent Labs: No results found for requested labs within last 365 days.    Lipid Panel    Component Value Date/Time   CHOL 122 11/19/2020 0236   TRIG 85 11/19/2020 0236   HDL 32 (L) 11/19/2020 0236   CHOLHDL 3.8 11/19/2020 0236   VLDL 17 11/19/2020 0236   LDLCALC 73 11/19/2020 0236      Wt Readings from Last 3 Encounters:  03/22/23 130 lb 8 oz (59.2 kg)  08/21/22 140  lb (63.5 kg)  10/19/21 136 lb 2 oz (61.7 kg)           10/25/2020    4:44 PM  PAD Screen  Previous PAD dx? No  Previous surgical procedure? No  Pain with walking? No  Feet/toe relief with dangling? No  Painful, non-healing ulcers? No  Extremities discolored? Yes      ASSESSMENT AND PLAN:  1.  Coronary artery disease involving native coronary arteries with stable angina: His EKG is normal with no ischemic changes.  I do not feel that his symptoms have changed from before.  He is able to walk with no exertional symptoms.  He does have some symptoms when it is extremely hot.   I recommend continuing current medications.  I refilled sublingual nitroglycerin.  2.  Hyperlipidemia: Continue treatment with rosuvastatin 20 mg once daily.  Most recent lipid profile showed an LDL of 72.  He does complain of aching in his thighs especially with walking but the symptoms did not start with initiation of rosuvastatin.  3.  Bilateral leg exertional pain: His symptoms are suggestive of claudication.  However, his femoral and distal pulses are palpable.  I doubt obstructive disease.  4.  Anxiety and insomnia: I suspect that the majority of his symptoms are related to this.    Disposition:   Follow-up  with me in 6 months.  Signed,  Lorine Bears, MD  03/22/2023 10:18 AM    [facility]

## 2023-03-24 MED ORDER — AMITRIPTYLINE HCL 25 MG PO TABS: 25.0000 mg | ORAL_TABLET | Freq: Every day | ORAL | 6 refills | Status: DC

## 2023-04-02 ENCOUNTER — Other Ambulatory Visit: Payer: Self-pay

## 2023-04-02 ENCOUNTER — Emergency Department (HOSPITAL_COMMUNITY): Payer: BC Managed Care – PPO

## 2023-04-02 ENCOUNTER — Emergency Department (HOSPITAL_COMMUNITY)
Admission: EM | Admit: 2023-04-02 | Discharge: 2023-04-02 | Disposition: A | Discharge status: Home / Self Care | Payer: BC Managed Care – PPO | Attending: Emergency Medicine | Admitting: Emergency Medicine

## 2023-04-02 ENCOUNTER — Encounter (HOSPITAL_COMMUNITY): Payer: Self-pay | Admitting: *Deleted

## 2023-04-02 DIAGNOSIS — R000 Tachycardia, unspecified: Secondary | ICD-10-CM | POA: Diagnosis present

## 2023-04-02 DIAGNOSIS — I2510 Atherosclerotic heart disease of native coronary artery without angina pectoris: Secondary | ICD-10-CM | POA: Insufficient documentation

## 2023-04-02 DIAGNOSIS — I480 Paroxysmal atrial fibrillation: Secondary | ICD-10-CM | POA: Insufficient documentation

## 2023-04-02 DIAGNOSIS — Z7902 Long term (current) use of antithrombotics/antiplatelets: Secondary | ICD-10-CM | POA: Insufficient documentation

## 2023-04-02 DIAGNOSIS — Z7901 Long term (current) use of anticoagulants: Secondary | ICD-10-CM | POA: Insufficient documentation

## 2023-04-02 LAB — TSH: TSH: 1.904 u[IU]/mL (ref 0.350–4.500)

## 2023-04-02 LAB — COMPREHENSIVE METABOLIC PANEL
ALT: 16 U/L (ref 0–44)
AST: 17 U/L (ref 15–41)
Albumin: 4.2 g/dL (ref 3.5–5.0)
Alkaline Phosphatase: 26 U/L — ABNORMAL LOW (ref 38–126)
Anion gap: 8 (ref 5–15)
BUN: 18 mg/dL (ref 8–23)
CO2: 27 mmol/L (ref 22–32)
Calcium: 8.7 mg/dL — ABNORMAL LOW (ref 8.9–10.3)
Chloride: 92 mmol/L — ABNORMAL LOW (ref 98–111)
Creatinine, Ser: 1.12 mg/dL (ref 0.61–1.24)
GFR, Estimated: >60 mL/min (ref >60)
Glucose, Bld: 115 mg/dL — ABNORMAL HIGH (ref 70–99)
Potassium: 4.3 mmol/L (ref 3.5–5.1)
Sodium: 127 mmol/L — ABNORMAL LOW (ref 135–145)
Total Bilirubin: 0.8 mg/dL (ref 0.3–1.2)
Total Protein: 6.8 g/dL (ref 6.5–8.1)

## 2023-04-02 LAB — CBC WITH DIFFERENTIAL/PLATELET
Abs Immature Granulocytes: 0.02 10*3/uL (ref 0.00–0.07)
Basophils Absolute: 0 10*3/uL (ref 0.0–0.1)
Basophils Relative: 0 %
Eosinophils Absolute: 0 10*3/uL (ref 0.0–0.5)
Eosinophils Relative: 0 %
HCT: 41.9 % (ref 39.0–52.0)
Hemoglobin: 14.3 g/dL (ref 13.0–17.0)
Immature Granulocytes: 0 %
Lymphocytes Relative: 23 %
Lymphs Abs: 1.4 10*3/uL (ref 0.7–4.0)
MCH: 30.8 pg (ref 26.0–34.0)
MCHC: 34.1 g/dL (ref 30.0–36.0)
MCV: 90.1 fL (ref 80.0–100.0)
Monocytes Absolute: 0.6 10*3/uL (ref 0.1–1.0)
Monocytes Relative: 10 %
Neutro Abs: 4 10*3/uL (ref 1.7–7.7)
Neutrophils Relative %: 67 %
Platelets: 158 10*3/uL (ref 150–400)
RBC: 4.65 MIL/uL (ref 4.22–5.81)
RDW: 12.5 % (ref 11.5–15.5)
WBC: 6 10*3/uL (ref 4.0–10.5)
nRBC: 0 % (ref 0.0–0.2)

## 2023-04-02 LAB — TROPONIN I (HIGH SENSITIVITY)
Troponin I (High Sensitivity): 15 ng/L (ref <18)
Troponin I (High Sensitivity): 20 ng/L — ABNORMAL HIGH (ref <18)

## 2023-04-02 LAB — D-DIMER, QUANTITATIVE: D-Dimer, Quant: 0.3 ug/mL-FEU (ref 0.00–0.50)

## 2023-04-02 MED ORDER — DILTIAZEM HCL 25 MG/5ML IV SOLN: 10.0000 mg | Freq: Once | INTRAVENOUS | Status: DC

## 2023-04-02 MED ORDER — SODIUM CHLORIDE 0.9 % IV BOLUS
1000.0000 mL | Freq: Once | INTRAVENOUS | Status: AC
  Administered 2023-04-02: 1000 mL via INTRAVENOUS

## 2023-04-02 MED ORDER — APIXABAN 5 MG PO TABS: 5.0000 mg | ORAL_TABLET | Freq: Two times a day (BID) | ORAL | 0 refills | Status: DC

## 2023-04-02 MED ORDER — METOPROLOL TARTRATE 5 MG/5ML IV SOLN: 5.0000 mg | Freq: Once | INTRAVENOUS | Status: DC

## 2023-04-02 NOTE — ED Provider Notes (Signed)
[place] EMERGENCY DEPARTMENT AT [facility] Provider Note   CSN: [id number] Arrival date & time: 04/02/23  1559     History  Chief Complaint  Patient presents with   Tachycardia    Christopher [name] is a 67 y.o. male history of CAD s/p stents here with tachycardia.  Patient states that he woke up this morning and had palpitations.  Patient states that it is hard for him to catch his breath.  Patient also has some epigastric pain as well.  Patient went to see his primary care doctor and was noted to be in heart rate of 150s.  He states that he occasionally has palpitations but has seen his cardiology previously and was never in atrial fibrillation.  Patient is already on Ranexa and Plavix for his CAD.  Patient denies any recent travel but did go to [place] to pick up his family yesterday.  The history is provided by the patient.       Home Medications Prior to Admission medications   Medication Sig Start Date End Date Taking? Authorizing Provider  ALPRAZolam (XANAX) 0.25 MG tablet Take 1 tablet (0.25 mg total) by mouth at bedtime as needed for anxiety. 02/20/23   Iran Ouch, MD  amitriptyline (ELAVIL) 25 MG tablet Take 1 tablet (25 mg total) by mouth at bedtime. 03/24/23   Iran Ouch, MD  clopidogrel (PLAVIX) 75 MG tablet Take 1 tablet (75 mg total) by mouth daily. 08/21/22   Iran Ouch, MD  nitroGLYCERIN (NITROSTAT) 0.4 MG SL tablet DISSOLVE ONE TABLET UNDER TONGUE EVERY 5 MINUTES AS NEEDED FOR CHEST PAIN 03/22/23   Iran Ouch, MD  ranolazine (RANEXA) 1000 MG SR tablet Take 1 tablet (1,000 mg total) by mouth 2 (two) times daily. 08/21/22   Iran Ouch, MD  rosuvastatin (CRESTOR) 20 MG tablet Take 1 tablet (20 mg total) by mouth daily. 08/21/22   Iran Ouch, MD      Allergies    Azithromycin    Review of Systems   Review of Systems  Cardiovascular:  Positive for palpitations.  All other systems reviewed and are  negative.   Physical Exam Updated Vital Signs BP (!) 111/91   Pulse (!) 147   Temp 97.8 F (36.6 C) (Oral)   Resp 14   Ht 5\' 8"  (1.727 m)   Wt 59.1 kg   SpO2 99%   BMI 19.82 kg/m  Physical Exam Vitals and nursing note reviewed.  Constitutional:      Appearance: Normal appearance.  HENT:     Head: Normocephalic.     Right Ear: Tympanic membrane normal.     Left Ear: Tympanic membrane normal.     Nose: Nose normal.     Mouth/Throat:     Mouth: Mucous membranes are moist.  Eyes:     Extraocular Movements: Extraocular movements intact.     Pupils: Pupils are equal, round, and reactive to light.  Cardiovascular:     Rate and Rhythm: Tachycardia present. Rhythm irregular.  Pulmonary:     Effort: Pulmonary effort is normal.     Breath sounds: Normal breath sounds.  Abdominal:     General: Abdomen is flat.     Palpations: Abdomen is soft.     Comments: Mild epigastric tenderness  Musculoskeletal:        General: Normal range of motion.     Cervical back: Normal range of motion and neck supple.  Skin:    General: Skin  is warm.     Capillary Refill: Capillary refill takes less than 2 seconds.  Neurological:     General: No focal deficit present.     Mental Status: He is alert and oriented to person, place, and time.  Psychiatric:        Mood and Affect: Mood normal.        Behavior: Behavior normal.     ED Results / Procedures / Treatments   Labs (all labs ordered are listed, but only abnormal results are displayed) Labs Reviewed  CBC WITH DIFFERENTIAL/PLATELET  COMPREHENSIVE METABOLIC PANEL  TSH  D-DIMER, QUANTITATIVE  TROPONIN I (HIGH SENSITIVITY)    EKG EKG Interpretation Date/Time:  Tuesday April 02 2023 16:29:08 EDT Ventricular Rate:  87 PR Interval:  151 QRS Duration:  74 QT Interval:  351 QTC Calculation: 423 R Axis:   71  Text Interpretation: Sinus rhythm Borderline ST elevation, anterior leads previous rhythm is afib Confirmed by Richardean Canal  [phone]) on 04/02/2023 4:42:35 PM  Radiology No results found.  Procedures Procedures    Medications Ordered in ED Medications  diltiazem (CARDIZEM) injection 10 mg (has no administration in time range)  sodium chloride 0.9 % bolus 1,000 mL (1,000 mLs Intravenous New Bag/Given 04/02/23 1626)    ED Course/ Medical Decision Making/ A&P                             Medical Decision Making CHUKWUMA TORNES is a 67 y.o. male here with chest pain, epigastric pain.  Patient does have a history of CAD with stents.  He is compliant with his Ranexa and Plavix and has no obvious ischemic changes on EKG.  I considered PE and hyperthyroidism as well.  Plan to get CBC and CMP and troponin x 2.  Patient's [place] Vasc score is 3 and will need anticoagulation.  Will likely need to contact his cardiologist since he is on dual platelet therapy.  7:14 PM Patient is back in sinus rhythm.  I discussed case with Dr. Jacques Navy from cardiology.  Patient had initial troponin of 15 and went up to 20.  D-dimer is normal.  I reviewed the tracing with cardiology.  It is unclear if patient has SVT or A-fib at 2-1 block.  Given his [place] vas is 3, she recommend starting anticoagulation and close follow-up in clinic.  Since patient's last stent was 2 years ago he may not need Plavix.  Problems Addressed: Paroxysmal atrial fibrillation [facility]): acute illness or injury  Amount and/or Complexity of Data Reviewed Labs: ordered. Decision-making details documented in ED Course. Radiology: ordered and independent interpretation performed. Decision-making details documented in ED Course. ECG/medicine tests: ordered and independent interpretation performed. Decision-making details documented in ED Course.  Risk Prescription drug management.    Final Clinical Impression(s) / ED Diagnoses Final diagnoses:  None    Rx / DC Orders ED Discharge Orders     None         Charlynne Pander, MD 04/02/23 1919

## 2023-04-02 NOTE — ED Triage Notes (Signed)
Pt from [facility] with tachycardia, feeling a little light headed, notes to be 160 wide complex rhythm, pt alert and oriented.Christopher [name]

## 2023-04-02 NOTE — Discharge Instructions (Addendum)
You have paroxysmal atrial fibrillation.  I have started you on Eliquis twice daily  You need to call your cardiologist tomorrow for follow-up  You likely need a Holter monitor  Return to ER if you have worse palpitations or chest pain or shortness of breath

## 2023-04-04 ENCOUNTER — Telehealth: Payer: Self-pay | Admitting: *Deleted

## 2023-04-04 DIAGNOSIS — I4892 Unspecified atrial flutter: Secondary | ICD-10-CM

## 2023-04-04 DIAGNOSIS — R002 Palpitations: Secondary | ICD-10-CM

## 2023-04-04 NOTE — Telephone Encounter (Signed)
-----   Message from Lorine Bears sent at 04/04/2023  7:43 AM EDT ----- He went to the ED recently with tachycardia likely due to atrial flutter.  He was started on Eliquis. Please ask him to stop Plavix. Schedule him for an echocardiogram for paroxysmal atrial flutter and lets get a 2 weeks ZIO monitor for palpitations and tachycardia.  He has a follow-up appointment scheduled with Lavonna Rua

## 2023-04-04 NOTE — Telephone Encounter (Signed)
Left a message for the patient to call back.  

## 2023-04-05 ENCOUNTER — Ambulatory Visit: Payer: BC Managed Care – PPO | Attending: Cardiovascular Disease

## 2023-04-05 DIAGNOSIS — R002 Palpitations: Secondary | ICD-10-CM

## 2023-04-05 NOTE — Telephone Encounter (Signed)
Patient returned staff call. 

## 2023-04-05 NOTE — Telephone Encounter (Signed)
Left a message for the patient to call back.  

## 2023-04-05 NOTE — Telephone Encounter (Signed)
Patient has been made aware to stop the Plavix. Echo and Monitor have been ordered. Instructions provided for the monitor. Patient has a follow up on 7/17.

## 2023-04-10 ENCOUNTER — Ambulatory Visit: Payer: BC Managed Care – PPO | Attending: Cardiology | Admitting: Cardiology

## 2023-04-10 ENCOUNTER — Encounter: Payer: Self-pay | Admitting: Cardiology

## 2023-04-10 ENCOUNTER — Other Ambulatory Visit
Admission: RE | Admit: 2023-04-10 | Discharge: 2023-04-10 | Disposition: A | Discharge status: Home / Self Care | Payer: BC Managed Care – PPO | Source: Ambulatory Visit | Attending: Cardiology | Admitting: Cardiology

## 2023-04-10 VITALS — BP 108/70 | HR 73 | Ht 68.0 in | Wt 132.0 lb

## 2023-04-10 DIAGNOSIS — E538 Deficiency of other specified B group vitamins: Secondary | ICD-10-CM | POA: Diagnosis not present

## 2023-04-10 DIAGNOSIS — I25118 Atherosclerotic heart disease of native coronary artery with other forms of angina pectoris: Secondary | ICD-10-CM | POA: Diagnosis not present

## 2023-04-10 DIAGNOSIS — R002 Palpitations: Secondary | ICD-10-CM | POA: Insufficient documentation

## 2023-04-10 DIAGNOSIS — E785 Hyperlipidemia, unspecified: Secondary | ICD-10-CM

## 2023-04-10 DIAGNOSIS — M79604 Pain in right leg: Secondary | ICD-10-CM

## 2023-04-10 DIAGNOSIS — M79605 Pain in left leg: Secondary | ICD-10-CM

## 2023-04-10 DIAGNOSIS — I4892 Unspecified atrial flutter: Secondary | ICD-10-CM | POA: Diagnosis not present

## 2023-04-10 LAB — BASIC METABOLIC PANEL
Anion gap: 3 — ABNORMAL LOW (ref 5–15)
BUN: 13 mg/dL (ref 8–23)
CO2: 30 mmol/L (ref 22–32)
Calcium: 8.6 mg/dL — ABNORMAL LOW (ref 8.9–10.3)
Chloride: 98 mmol/L (ref 98–111)
Creatinine, Ser: 0.94 mg/dL (ref 0.61–1.24)
GFR, Estimated: >60 mL/min (ref >60)
Glucose, Bld: 92 mg/dL (ref 70–99)
Potassium: 4.7 mmol/L (ref 3.5–5.1)
Sodium: 131 mmol/L — ABNORMAL LOW (ref 135–145)

## 2023-04-10 LAB — CBC
HCT: 39.8 % (ref 39.0–52.0)
Hemoglobin: 13.6 g/dL (ref 13.0–17.0)
MCH: 30.6 pg (ref 26.0–34.0)
MCHC: 34.2 g/dL (ref 30.0–36.0)
MCV: 89.4 fL (ref 80.0–100.0)
Platelets: 155 10*3/uL (ref 150–400)
RBC: 4.45 MIL/uL (ref 4.22–5.81)
RDW: 12.8 % (ref 11.5–15.5)
WBC: 4.5 10*3/uL (ref 4.0–10.5)
nRBC: 0 % (ref 0.0–0.2)

## 2023-04-10 LAB — VITAMIN B12: Vitamin B-12: 166 pg/mL — ABNORMAL LOW (ref 180–914)

## 2023-04-10 MED ORDER — ALPRAZOLAM 0.25 MG PO TABS: 0.2500 mg | ORAL_TABLET | Freq: Every evening | ORAL | 5 refills | Status: AC | PRN

## 2023-04-10 MED ORDER — ALPRAZOLAM 0.25 MG PO TABS: 0.2500 mg | ORAL_TABLET | Freq: Every evening | ORAL | 5 refills | Status: DC | PRN

## 2023-04-10 NOTE — Patient Instructions (Addendum)
Medication Instructions:  Your physician recommends that you continue on your current medications as directed. Please refer to the Current Medication list given to you today.  *If you need a refill on your cardiac medications before your next appointment, please call your pharmacy*   Lab Work: Your provider would like for you to have following labs drawn: BMP, CBC, Vitamin b12.   Please go to the [facility] entrance and check in at the front desk.  You do not need an appointment.  They are open from 7am-6 pm.  If you have labs (blood work) drawn today and your tests are completely normal, you will receive your results only by: MyChart Message (if you have MyChart) OR A paper copy in the mail If you have any lab test that is abnormal or we need to change your treatment, we will call you to review the results.   Testing/Procedures: Your physician has requested that you have an echocardiogram. Echocardiography is a painless test that uses sound waves to create images of your heart. It provides your doctor with information about the size and shape of your heart and how well your heart's chambers and valves are working.   You may receive an ultrasound enhancing agent through an IV if needed to better visualize your heart during the echo. This procedure takes approximately one hour.  There are no restrictions for this procedure.  This will take place at [address], [place] [address]  Your physician has recommended that you wear a Zio monitor 14 days.   This monitor is a medical device that records the heart's electrical activity. Doctors most often use these monitors to diagnose arrhythmias. Arrhythmias are problems with the speed or rhythm of the heartbeat. The monitor is a small device applied to your chest. You can wear one while you do your normal daily activities. While wearing this monitor if you have any symptoms to push the button and record  what you felt. Once you have worn this monitor for the period of time provider prescribed (Usually 14 days), you will return the monitor device in the postage paid box. Once it is returned they will download the data collected and provide [place] with a report which the provider will then review and we will call you with those results. Important tips:  Avoid showering during the first 24 hours of wearing the monitor. Avoid excessive sweating to help maximize wear time. Do not submerge the device, no hot tubs, and no swimming pools. Keep any lotions or oils away from the patch. After 24 hours you may shower with the patch on. Take brief showers with your back facing the shower head.  Do not remove patch once it has been placed because that will interrupt data and decrease adhesive wear time. Push the button when you have any symptoms and write down what you were feeling. Once you have completed wearing your monitor, remove and place into box which has postage paid and place in your outgoing mailbox.  If for some reason you have misplaced your box then call our office and we can provide another box and/or mail it off for you.      Follow-Up: At [facility], you and your health needs are our priority.  As part of our continuing mission to provide you with exceptional heart care, we have created designated Provider Care Teams.  These Care Teams include your primary Cardiologist (physician) and Advanced Practice Providers (APPs -  Physician Assistants and Nurse Practitioners) who all work together to provide you with the care you need, when you need it.  We recommend signing up for the patient portal called "MyChart".  Sign up information is provided on this After Visit Summary.  MyChart is used to connect with patients for Virtual Visits (Telemedicine).  Patients are able to view lab/test results, encounter notes, upcoming appointments, etc.  Non-urgent messages can be sent to your provider as  well.   To learn more about what you can do with MyChart, go to [email or web address].    Your next appointment:   7 week(s)  Provider:   You may see Lorine Bears, MD or one of the following Advanced Practice Providers on your designated Care Team:
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
## 2023-04-10 NOTE — Progress Notes (Signed)
Sodium level is slowly improving. Continue to avoid excessive water intake. Continue current medication regimen without changes.

## 2023-04-12 NOTE — Progress Notes (Signed)
Vitamin B12 is low. Please send labs to PCP for evaluation and treatment.

## 2023-04-14 ENCOUNTER — Encounter: Payer: Self-pay | Admitting: Cardiology

## 2023-04-30 ENCOUNTER — Ambulatory Visit (HOSPITAL_COMMUNITY): Payer: BC Managed Care – PPO | Attending: Cardiovascular Disease

## 2023-04-30 DIAGNOSIS — R002 Palpitations: Secondary | ICD-10-CM | POA: Diagnosis present

## 2023-04-30 DIAGNOSIS — I4892 Unspecified atrial flutter: Secondary | ICD-10-CM | POA: Diagnosis not present

## 2023-04-30 LAB — ECHOCARDIOGRAM COMPLETE
Area-P 1/2: 2.93 cm2
S' Lateral: 2.7 cm

## 2023-05-01 NOTE — Progress Notes (Signed)
Heart squeeze remains strong and 6965%.  There are no wall motion abnormalities that are noted.  There are also no valvular abnormalities that were noted.  Overall it is a normal study without findings to suggest symptoms.

## 2023-05-05 ENCOUNTER — Emergency Department (HOSPITAL_COMMUNITY): Payer: BC Managed Care – PPO

## 2023-05-05 ENCOUNTER — Inpatient Hospital Stay (HOSPITAL_COMMUNITY)
Admission: EM | Admit: 2023-05-05 | Discharge: 2023-05-07 | DRG: 641 | Disposition: A | Discharge status: Home / Self Care | Payer: BC Managed Care – PPO | Attending: Internal Medicine | Admitting: Internal Medicine

## 2023-05-05 ENCOUNTER — Other Ambulatory Visit: Payer: Self-pay

## 2023-05-05 DIAGNOSIS — F419 Anxiety disorder, unspecified: Secondary | ICD-10-CM

## 2023-05-05 DIAGNOSIS — Z8249 Family history of ischemic heart disease and other diseases of the circulatory system: Secondary | ICD-10-CM

## 2023-05-05 DIAGNOSIS — R6889 Other general symptoms and signs: Secondary | ICD-10-CM

## 2023-05-05 DIAGNOSIS — Z7901 Long term (current) use of anticoagulants: Secondary | ICD-10-CM

## 2023-05-05 DIAGNOSIS — E860 Dehydration: Principal | ICD-10-CM | POA: Diagnosis present

## 2023-05-05 DIAGNOSIS — R410 Disorientation, unspecified: Secondary | ICD-10-CM

## 2023-05-05 DIAGNOSIS — I951 Orthostatic hypotension: Secondary | ICD-10-CM | POA: Diagnosis present

## 2023-05-05 DIAGNOSIS — I2510 Atherosclerotic heart disease of native coronary artery without angina pectoris: Secondary | ICD-10-CM | POA: Diagnosis present

## 2023-05-05 DIAGNOSIS — R4182 Altered mental status, unspecified: Secondary | ICD-10-CM | POA: Diagnosis present

## 2023-05-05 DIAGNOSIS — R471 Dysarthria and anarthria: Secondary | ICD-10-CM

## 2023-05-05 DIAGNOSIS — Z881 Allergy status to other antibiotic agents status: Secondary | ICD-10-CM

## 2023-05-05 DIAGNOSIS — I471 Supraventricular tachycardia, unspecified: Secondary | ICD-10-CM | POA: Diagnosis not present

## 2023-05-05 DIAGNOSIS — G4700 Insomnia, unspecified: Secondary | ICD-10-CM

## 2023-05-05 DIAGNOSIS — E871 Hypo-osmolality and hyponatremia: Secondary | ICD-10-CM | POA: Diagnosis present

## 2023-05-05 DIAGNOSIS — E785 Hyperlipidemia, unspecified: Secondary | ICD-10-CM | POA: Diagnosis present

## 2023-05-05 DIAGNOSIS — Z83438 Family history of other disorder of lipoprotein metabolism and other lipidemia: Secondary | ICD-10-CM

## 2023-05-05 DIAGNOSIS — Z79899 Other long term (current) drug therapy: Secondary | ICD-10-CM

## 2023-05-05 DIAGNOSIS — I7300 Raynaud's syndrome without gangrene: Secondary | ICD-10-CM | POA: Diagnosis present

## 2023-05-05 DIAGNOSIS — Z833 Family history of diabetes mellitus: Secondary | ICD-10-CM

## 2023-05-05 DIAGNOSIS — I480 Paroxysmal atrial fibrillation: Secondary | ICD-10-CM

## 2023-05-05 DIAGNOSIS — Z823 Family history of stroke: Secondary | ICD-10-CM

## 2023-05-05 DIAGNOSIS — Z955 Presence of coronary angioplasty implant and graft: Secondary | ICD-10-CM

## 2023-05-05 DIAGNOSIS — K219 Gastro-esophageal reflux disease without esophagitis: Secondary | ICD-10-CM

## 2023-05-05 LAB — PROTIME-INR
INR: 1.1 (ref 0.8–1.2)
Prothrombin Time: 14.5 seconds (ref 11.4–15.2)

## 2023-05-05 LAB — URINALYSIS, ROUTINE W REFLEX MICROSCOPIC
Bilirubin Urine: NEGATIVE
Glucose, UA: 50 mg/dL — AB
Hgb urine dipstick: NEGATIVE
Ketones, ur: NEGATIVE mg/dL
Leukocytes,Ua: NEGATIVE
Nitrite: NEGATIVE
Protein, ur: NEGATIVE mg/dL
Specific Gravity, Urine: 1.029 (ref 1.005–1.030)
pH: 7 (ref 5.0–8.0)

## 2023-05-05 LAB — I-STAT CHEM 8, ED
BUN: 13 mg/dL (ref 8–23)
Calcium, Ion: 1.02 mmol/L — ABNORMAL LOW (ref 1.15–1.40)
Chloride: 94 mmol/L — ABNORMAL LOW (ref 98–111)
Creatinine, Ser: 0.9 mg/dL (ref 0.61–1.24)
Glucose, Bld: 109 mg/dL — ABNORMAL HIGH (ref 70–99)
HCT: 34 % — ABNORMAL LOW (ref 39.0–52.0)
Hemoglobin: 11.6 g/dL — ABNORMAL LOW (ref 13.0–17.0)
Potassium: 4.4 mmol/L (ref 3.5–5.1)
Sodium: 126 mmol/L — ABNORMAL LOW (ref 135–145)
TCO2: 24 mmol/L (ref 22–32)

## 2023-05-05 LAB — CBG MONITORING, ED: Glucose-Capillary: 107 mg/dL — ABNORMAL HIGH (ref 70–99)

## 2023-05-05 LAB — RAPID URINE DRUG SCREEN, HOSP PERFORMED
Amphetamines: NOT DETECTED
Barbiturates: NOT DETECTED
Benzodiazepines: NOT DETECTED
Cocaine: NOT DETECTED
Opiates: NOT DETECTED
Tetrahydrocannabinol: NOT DETECTED

## 2023-05-05 LAB — CBC
HCT: 36.5 % — ABNORMAL LOW (ref 39.0–52.0)
Hemoglobin: 12.8 g/dL — ABNORMAL LOW (ref 13.0–17.0)
MCH: 31.6 pg (ref 26.0–34.0)
MCHC: 35.1 g/dL (ref 30.0–36.0)
MCV: 90.1 fL (ref 80.0–100.0)
Platelets: 136 10*3/uL — ABNORMAL LOW (ref 150–400)
RBC: 4.05 MIL/uL — ABNORMAL LOW (ref 4.22–5.81)
RDW: 12.7 % (ref 11.5–15.5)
WBC: 4.2 10*3/uL (ref 4.0–10.5)
nRBC: 0 % (ref 0.0–0.2)

## 2023-05-05 LAB — COMPREHENSIVE METABOLIC PANEL
ALT: 18 U/L (ref 0–44)
AST: 24 U/L (ref 15–41)
Albumin: 3.8 g/dL (ref 3.5–5.0)
Alkaline Phosphatase: 23 U/L — ABNORMAL LOW (ref 38–126)
Anion gap: 10 (ref 5–15)
BUN: 12 mg/dL (ref 8–23)
CO2: 23 mmol/L (ref 22–32)
Calcium: 8.4 mg/dL — ABNORMAL LOW (ref 8.9–10.3)
Chloride: 91 mmol/L — ABNORMAL LOW (ref 98–111)
Creatinine, Ser: 1 mg/dL (ref 0.61–1.24)
GFR, Estimated: >60 mL/min (ref >60)
Glucose, Bld: 115 mg/dL — ABNORMAL HIGH (ref 70–99)
Potassium: 4.7 mmol/L (ref 3.5–5.1)
Sodium: 124 mmol/L — ABNORMAL LOW (ref 135–145)
Total Bilirubin: 1.7 mg/dL — ABNORMAL HIGH (ref 0.3–1.2)
Total Protein: 5.8 g/dL — ABNORMAL LOW (ref 6.5–8.1)

## 2023-05-05 LAB — OSMOLALITY: Osmolality: 276 mOsm/kg (ref 275–295)

## 2023-05-05 LAB — ETHANOL: Alcohol, Ethyl (B): <10 mg/dL (ref <10)

## 2023-05-05 LAB — DIFFERENTIAL
Abs Immature Granulocytes: 0.01 10*3/uL (ref 0.00–0.07)
Basophils Absolute: 0 10*3/uL (ref 0.0–0.1)
Basophils Relative: 1 %
Eosinophils Absolute: 0 10*3/uL (ref 0.0–0.5)
Eosinophils Relative: 1 %
Immature Granulocytes: 0 %
Lymphocytes Relative: 29 %
Lymphs Abs: 1.2 10*3/uL (ref 0.7–4.0)
Monocytes Absolute: 0.3 10*3/uL (ref 0.1–1.0)
Monocytes Relative: 8 %
Neutro Abs: 2.6 10*3/uL (ref 1.7–7.7)
Neutrophils Relative %: 61 %

## 2023-05-05 LAB — TSH: TSH: 1.654 u[IU]/mL (ref 0.350–4.500)

## 2023-05-05 LAB — VITAMIN B12: Vitamin B-12: 217 pg/mL (ref 180–914)

## 2023-05-05 LAB — HIV ANTIBODY (ROUTINE TESTING W REFLEX): HIV Screen 4th Generation wRfx: NONREACTIVE

## 2023-05-05 LAB — CORTISOL-AM, BLOOD: Cortisol - AM: 12.7 ug/dL (ref 6.7–22.6)

## 2023-05-05 LAB — APTT: aPTT: 37 seconds — ABNORMAL HIGH (ref 24–36)

## 2023-05-05 LAB — OSMOLALITY, URINE: Osmolality, Ur: 390 mOsm/kg (ref 300–900)

## 2023-05-05 LAB — SODIUM, URINE, RANDOM: Sodium, Ur: 53 mmol/L

## 2023-05-05 MED ORDER — ACETAMINOPHEN 325 MG PO TABS: 650.0000 mg | ORAL_TABLET | Freq: Four times a day (QID) | ORAL | Status: DC | PRN

## 2023-05-05 MED ORDER — RANOLAZINE ER 500 MG PO TB12
1000.0000 mg | ORAL_TABLET | Freq: Two times a day (BID) | ORAL | Status: DC
  Administered 2023-05-05 – 2023-05-07 (×4): 1000 mg via ORAL
  Filled 2023-05-05 (×4): qty 2

## 2023-05-05 MED ORDER — APIXABAN 5 MG PO TABS
5.0000 mg | ORAL_TABLET | Freq: Two times a day (BID) | ORAL | Status: DC
  Administered 2023-05-05 – 2023-05-07 (×4): 5 mg via ORAL
  Filled 2023-05-05 (×5): qty 1

## 2023-05-05 MED ORDER — ROSUVASTATIN CALCIUM 20 MG PO TABS
20.0000 mg | ORAL_TABLET | Freq: Every day | ORAL | Status: DC
  Administered 2023-05-05 – 2023-05-06 (×2): 20 mg via ORAL
  Filled 2023-05-05 (×2): qty 1

## 2023-05-05 MED ORDER — ACETAMINOPHEN 650 MG RE SUPP: 650.0000 mg | Freq: Four times a day (QID) | RECTAL | Status: DC | PRN

## 2023-05-05 MED ORDER — ALPRAZOLAM 0.25 MG PO TABS: 0.2500 mg | ORAL_TABLET | Freq: Every day | ORAL | Status: DC | PRN

## 2023-05-05 MED ORDER — IOHEXOL 350 MG/ML SOLN
75.0000 mL | Freq: Once | INTRAVENOUS | Status: AC | PRN
  Administered 2023-05-05: 75 mL via INTRAVENOUS

## 2023-05-05 MED ORDER — TRAZODONE HCL 50 MG PO TABS
25.0000 mg | ORAL_TABLET | Freq: Every day | ORAL | Status: DC
  Administered 2023-05-05 – 2023-05-06 (×2): 25 mg via ORAL
  Filled 2023-05-05 (×2): qty 1

## 2023-05-05 NOTE — Assessment & Plan Note (Signed)
10/2020: The left circumflex had 80% stenosis. He underwent successful PCI with DES to the mid LAD. The procedure was very difficult due to calcifications. There was 50% residual stenosis in the proximal segment of the stent in spite of high-pressure balloon. 2 small diagonals were jailed by the stent with sluggish flow.  11/2020:He underwent staged DES placement to the mid left circumflex  Continue medical management with ranexa, crestor

## 2023-05-05 NOTE — Assessment & Plan Note (Signed)
Seen in ED in July with possible atrial fib vs. SVT EDP discussed with cardiology and decided to start eliquis 14 day monitor showed no arrhythmia, average of NSR with rate of 84. Two SVT runs occurred lasting 7 beats with max rate of 154bpm.  Keep on tele  Echo wnl 03/2023  Will continue eliquis and f/u with cardiology outpatient
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
## 2023-05-05 NOTE — H&P (Signed)
History and Physical    Patient: Christopher [name]:[id number] DOB: [birth year] DOA: 05/05/2023 DOS: the patient was seen and examined on 05/05/2023 PCP: Jackelyn Poling, DO  Patient coming from: Outside Hospital - lives with his wife and children.    Chief Complaint: transient dysarthric speech  HPI: Christopher [name] is a 67 y.o. male with medical history significant of recent PAF diagnosis on eliquis,  anxiety, CAD, GERD, HLD, Raynaud's disease who presents to ED as a code stroke due to transient confusion, slurred speech. He was at the [employer] and he got in the car and while they driving home they were talking and all of a sudden he couldn't talk. He had mumbled speech/dysarthric speech and slurred speech. He felt like his mouth went numb. He states he had numbness in his left hand, but this is not new. He states when he gets stressed he will have numbness in his left hand. He was very fatigued yesterday and had palpitations. He laid down most of day. Symptoms have resolved. He states it lasted for about 5 minutes.   He states recently he has become very heat intolerant. If he goes in the heat he gets a discomfort in his epigastric area then he gets palpitations and then he gets lightheaded/dizzy. Worse with driving. When he goes into the cool or relaxed symptoms resolve. He will check his blood pressure when this happens and it is normal. No wheezing/diarrhea.   He denies drinking excessive water, no alcohol. He has had insomnia over the past 4 months and has been under a lot of stress.    Denies any fever, but has been having chills, vision changes/headaches, chest pain, shortness of breath or cough, abdominal pain, N/V/D, dysuria or leg swelling.    He does not smoke or drink alcohol.   ER Course:  vitals: afebrile, bp: 114/85, HR: 161, RR: 18, oxygen: 99%RA Pertinent labs: sodium: 124,  CT head: no acute finding CTA head/neck: no LVO MRI brain: no acute finding In ED: code  stroke, neurology consulted. [facility] asked to admit    Review of Systems: As mentioned in the history of present illness. All other systems reviewed and are negative. Past Medical History:  Diagnosis Date   Anxiety    Cervical disc disease    Coronary artery disease    GERD (gastroesophageal reflux disease)    triggers by eating spicy food per pt   Raynaud's disease    Vitamin B 12 deficiency    Vitamin D deficiency    Past Surgical History:  Procedure Laterality Date   CARDIAC CATHETERIZATION     COLONOSCOPY  2011   Johnson-normal exam per pt   CORONARY IMAGING/OCT N/A 11/18/2020   Procedure: INTRAVASCULAR IMAGING/OCT;  Surgeon: Iran Ouch, MD;  Location: [facility];  Service: Cardiovascular;  Laterality: N/A;   CORONARY IMAGING/OCT N/A 12/16/2020   Procedure: INTRAVASCULAR IMAGING/OCT;  Surgeon: Iran Ouch, MD;  Location: [facility];  Service: Cardiovascular;  Laterality: N/A;   CORONARY STENT INTERVENTION  11/18/2020   CORONARY STENT INTERVENTION N/A 11/18/2020   Procedure: CORONARY STENT INTERVENTION;  Surgeon: Iran Ouch, MD;  Location: [facility];  Service: Cardiovascular;  Laterality: N/A;   CORONARY STENT INTERVENTION N/A 12/16/2020   Procedure: CORONARY STENT INTERVENTION;  Surgeon: Iran Ouch, MD;  Location: [facility];  Service: Cardiovascular;  Laterality: N/A;   infertility surgery     LEFT HEART CATH AND CORONARY ANGIOGRAPHY  N/A 11/18/2020   Procedure: LEFT HEART CATH AND CORONARY ANGIOGRAPHY;  Surgeon: Iran Ouch, MD;  Location: [facility];  Service: Cardiovascular;  Laterality: N/A;   NECK SURGERY  2007   UPPER GASTROINTESTINAL ENDOSCOPY  2020   Ganem-h.pylori per pt   Social History:  reports that he has never smoked. He has never used smokeless tobacco. He reports that he does not currently use alcohol. He reports that he does not currently use drugs.  Allergies  Allergen Reactions    Azithromycin Other (See Comments)    Stomach cramps     Family History  Problem Relation Age of Onset   Hyperlipidemia Mother    Hypertension Mother    Heart disease Mother 42       CABG x 3    Diabetes Mother    Stroke Father    CAD Father    Colon cancer Neg Hx    Colon polyps Neg Hx    Esophageal cancer Neg Hx    Rectal cancer Neg Hx    Stomach cancer Neg Hx     Prior to Admission medications   Medication Sig Start Date End Date Taking? Authorizing Provider  ALPRAZolam (XANAX) 0.25 MG tablet Take 1 tablet (0.25 mg total) by mouth at bedtime as needed for anxiety. 04/10/23   Iran Ouch, MD  apixaban (ELIQUIS) 5 MG TABS tablet Take 1 tablet (5 mg total) by mouth 2 (two) times daily. 04/02/23 05/02/23  Charlynne Pander, MD  nitroGLYCERIN (NITROSTAT) 0.4 MG SL tablet DISSOLVE ONE TABLET UNDER TONGUE EVERY 5 MINUTES AS NEEDED FOR CHEST PAIN 03/22/23   Iran Ouch, MD  ranolazine (RANEXA) 1000 MG SR tablet Take 1 tablet (1,000 mg total) by mouth 2 (two) times daily. 08/21/22   Iran Ouch, MD  rosuvastatin (CRESTOR) 20 MG tablet Take 1 tablet (20 mg total) by mouth daily. 08/21/22   Iran Ouch, MD    Physical Exam: Vitals:   05/05/23 1345 05/05/23 1451 05/05/23 1515 05/05/23 1528  BP: 134/69     Pulse: 71 72 75 81  Resp: 13 17 19 20   Temp:      TempSrc:      SpO2: 100% 100% 100% 100%  Weight:       General:  Appears calm and comfortable and is in NAD Eyes:  PERRL, EOMI, normal lids, iris ENT:  grossly normal hearing, lips & tongue, mmm; appropriate dentition Neck:  no LAD, masses or thyromegaly; no carotid bruits Cardiovascular:  RRR, no m/r/g. No LE edema.  Respiratory:   CTA bilaterally with no wheezes/rales/rhonchi.  Normal respiratory effort. Abdomen:  soft, NT, ND, NABS Back:   normal alignment, no CVAT Skin:  no rash or induration seen on limited exam. Sun damage to head/face  Musculoskeletal:  grossly normal tone BUE/BLE, good ROM, no bony  abnormality Lower extremity:  No LE edema.  Limited foot exam with no ulcerations.  2+ distal pulses. Psychiatric:  grossly normal mood and affect, speech fluent and appropriate, AOx3 Neurologic:  CN 2-12 grossly intact, moves all extremities in coordinated fashion, sensation intact   Radiological Exams on Admission: Independently reviewed - see discussion in A/P where applicable  MR BRAIN WO CONTRAST  Result Date: 05/05/2023 CLINICAL DATA:  67 year old male code stroke presentation today. Confusion. Atrial flutter on Eliquis. EXAM: MRI HEAD WITHOUT CONTRAST TECHNIQUE: Multiplanar, multiecho pulse sequences of the brain and surrounding structures were obtained without intravenous contrast. COMPARISON:  Head CT 1033 hours.  FINDINGS: Brain: No restricted diffusion to suggest acute infarction. No midline shift, mass effect, evidence of mass lesion, ventriculomegaly, extra-axial collection or acute intracranial hemorrhage. Cervicomedullary junction and pituitary are within normal limits. Largely normal for age gray and white matter signal throughout the brain. Minimal nonspecific white matter T2 and FLAIR hyperintensity, such as on series 6, image 20. No cortical encephalomalacia. No chronic cerebral blood products. Deep gray nuclei, brainstem and cerebellum appear negative. Vascular: Major intracranial vascular flow voids are preserved. Skull and upper cervical spine: Negative for age visible cervical spine. Visualized bone marrow signal is within normal limits. Sinuses/Orbits: Negative. Other: Visible internal auditory structures appear normal. Negative visible scalp and face. IMPRESSION: No acute intracranial abnormality. Largely normal for age noncontrast MRI appearance of the Brain. Electronically Signed   By: Odessa Fleming M.D.   On: 05/05/2023 12:23   CT ANGIO HEAD NECK W WO CM  Result Date: 05/05/2023 CLINICAL DATA:  Stroke/TIA EXAM: CT ANGIOGRAPHY HEAD AND NECK WITH AND WITHOUT CONTRAST TECHNIQUE:  Multidetector CT imaging of the head and neck was performed using the standard protocol during bolus administration of intravenous contrast. Multiplanar CT image reconstructions and MIPs were obtained to evaluate the vascular anatomy. Carotid stenosis measurements (when applicable) are obtained utilizing NASCET criteria, using the distal internal carotid diameter as the denominator. RADIATION DOSE REDUCTION: This exam was performed according to the departmental dose-optimization program which includes automated exposure control, adjustment of the mA and/or kV according to patient size and/or use of iterative reconstruction technique. CONTRAST:  75mL OMNIPAQUE IOHEXOL 350 MG/ML SOLN COMPARISON:  Same-day noncontrast CT head FINDINGS: CTA NECK FINDINGS Aortic arch: The imaged aortic arch is normal. The origins of the major branch vessels are patent. The subclavian arteries are patent to the level imaged. Right carotid system: The right common, internal, and external carotid arteries are patent, without hemodynamically significant stenosis or occlusion there is no evidence of dissection or aneurysm. Left carotid system: The left common, internal, and external carotid arteries are patent, without hemodynamically significant stenosis or occlusion. There is no evidence of dissection or aneurysm. Vertebral arteries: The vertebral arteries are patent, without hemodynamically significant stenosis or occlusion there is no evidence of dissection or aneurysm. Skeleton: There is no acute osseous abnormality or suspicious osseous lesion. There is no visible canal hematoma. The patient is status post C4 through C7 ACDF with solid osseous fusion. Other neck: The soft tissues of the neck are unremarkable. Upper chest: The imaged lung apices are clear. Review of the MIP images confirms the above findings CTA HEAD FINDINGS Anterior circulation: There is mild calcified plaque in the intracranial ICAs resulting in mild stenosis of the  paraclinoid segments bilaterally. The bilateral MCAs and ACAS are patent, without proximal stenosis or occlusion. The anterior communicating artery is normal. There is no aneurysm or AVM. Posterior circulation: The bilateral V4 segments are patent. The basilar artery is patent. The major cerebellar arteries appear patent. The bilateral PCAs are patent, without proximal stenosis or occlusion. Right larger than left posterior communicating arteries are identified with fetal origin on the right. There is no aneurysm or AVM. Venous sinuses: Patent. Anatomic variants: As above. Review of the MIP images confirms the above findings IMPRESSION: Patent vasculature of the head and neck with no hemodynamically significant stenosis, occlusion, or dissection. Findings communicated to Dr Iver Nestle at 11:12 am. Electronically Signed   By: Lesia Hausen M.D.   On: 05/05/2023 11:13   CT HEAD CODE STROKE WO CONTRAST  Result Date:  05/05/2023 CLINICAL DATA:  Code stroke.  67 year old male. EXAM: CT HEAD WITHOUT CONTRAST TECHNIQUE: Contiguous axial images were obtained from the base of the skull through the vertex without intravenous contrast. RADIATION DOSE REDUCTION: This exam was performed according to the departmental dose-optimization program which includes automated exposure control, adjustment of the mA and/or kV according to patient size and/or use of iterative reconstruction technique. COMPARISON:  None Available. FINDINGS: Brain: Cerebral volume is within normal limits for age. No midline shift, ventriculomegaly, mass effect, evidence of mass lesion, intracranial hemorrhage or evidence of cortically based acute infarction. Gray-white matter differentiation is within normal limits throughout the brain. No encephalomalacia identified. Incidental dural calcification along the falx. Vascular: No suspicious intracranial vascular hyperdensity. Skull: No acute osseous abnormality identified. Sinuses/Orbits: Mucosal thickening or  mucous retention cysts at the right maxillary alveolar recess. Otherwise clear. Other: Visualized orbits and scalp soft tissues are within normal limits. ASPECTS [facility] Stroke Program Early CT Score) Total score (0-10 with 10 being normal): 10 IMPRESSION: 1. Normal for age noncontrast CT appearance of the brain. ASPECTS 10. 2. These results were communicated to Dr. Iver Nestle at 10:37 am on 05/05/2023 by text page via the [facility] messaging system. Electronically Signed   By: Odessa Fleming M.D.   On: 05/05/2023 10:38    EKG: Independently reviewed.  NSR with rate 85; nonspecific ST changes with no evidence of acute ischemia   Labs on Admission: I have personally reviewed the available labs and imaging studies at the time of the admission.  Pertinent labs:   Sodium: 124  Assessment and Plan: Principal Problem:   transient dysarthria Active Problems:   Hyponatremia   Heat intolerance   Paroxysmal atrial fibrillation (HCC)   Anxiety   Insomnia   CAD (coronary artery disease)   Dyslipidemia    Assessment and Plan: * transient dysarthria Episode lasted 5 minutes in the car with some numbness in his left hand which happens when he is anxious CT head and MRI brain with no acute finding Neurology consulted in ED with normal MRI/CTA no further neuro w/u Already optimized on eliquis/crestor  Will keep on tele Check B12 and w/u hyponatremia Anxiety w/u/treatment and insomnia treatment   Hyponatremia Sodium two years ago 132 Then 7/9: 127 and then 7/17: 131 Now 124 Unsure if contributing to symptoms but will do w/u with urine studies,plasma osmolality, TSH He does not drink excessive water, alcohol and is on no medication with AE of hyponatremia  Will check AM cortisol, but addison's low on my differential  Strict I/O    Heat intolerance Differential includes emotional distress, pheochromocytoma, carcinoid, drugs, Ca -he has checked his blood pressure when he was having these episodes and bp has  been normal  -he has no wheezing or diarrhea or abdominal pain  -symptoms resolve when he relaxes and is the cool which makes emotional distress more likely  -ranexa could contribute to sweating -could consider imaging -optimize anxiety/sleep   Paroxysmal atrial fibrillation (HCC) Seen in ED in July with possible atrial fib vs. SVT EDP discussed with cardiology and decided to start eliquis 14 day monitor showed no arrhythmia, average of NSR with rate of 84. Two SVT runs occurred lasting 7 beats with max rate of 154bpm.  Keep on tele  Echo wnl 03/2023  Will continue eliquis and f/u with cardiology outpatient    Anxiety On xanax, but also using for sleep advised against this Possibly affecting his sleep/stress and having physical manifestation with tingling  4-5 years  ago he was put on an anti-depressant and it made him suicidal  Discussed optimal management would be with counseling and then SSRI/buspar/etc over BZD first line Would recommend trial hydroxyzine or klonopin over xanax  Discussed he could call pharmacy to see if he can figure out what drug he was on that gave him suicidal ideation if he can't get records from PCP.  TSH normal in July 2024 F/u with PCP   Insomnia Has tried amitriptyline and melatonin.  Now has used xanax, but discussed this is not for sleep  Trial trazodone, low dose May benefit from ashwaghanda as well with crossing benefits of his anxiety  Discussed sleep hygiene as well   CAD (coronary artery disease) 10/2020: The left circumflex had 80% stenosis. He underwent successful PCI with DES to the mid LAD. The procedure was very difficult due to calcifications. There was 50% residual stenosis in the proximal segment of the stent in spite of high-pressure balloon. 2 small diagonals were jailed by the stent with sluggish flow.  11/2020:He underwent staged DES placement to the mid left circumflex  Continue medical management with ranexa, crestor    Dyslipidemia Continue crestor     Advance Care Planning:   Code Status: Full Code   Consults: neurology   DVT Prophylaxis: eliquis   Family Communication: wife at bedside   Severity of Illness: The appropriate patient status for this patient is OBSERVATION. Observation status is judged to be reasonable and necessary in order to provide the required intensity of service to ensure the patient's safety. The patient's presenting symptoms, physical exam findings, and initial radiographic and laboratory data in the context of their medical condition is felt to place them at decreased risk for further clinical deterioration. Furthermore, it is anticipated that the patient will be medically stable for discharge from the hospital within 2 midnights of admission.   Author: Orland Mustard, MD 05/05/2023 4:04 PM  For on call review [email or web address].

## 2023-05-05 NOTE — Assessment & Plan Note (Addendum)
Has tried amitriptyline and melatonin.  Now has used xanax, but discussed this is not for sleep  Trial trazodone, low dose May benefit from ashwaghanda as well with crossing benefits of his anxiety  Discussed sleep hygiene as well

## 2023-05-05 NOTE — ED Notes (Signed)
ED TO INPATIENT HANDOFF REPORT  ED Nurse Name and Phone #:  Dwana Melena [phone]  S Name/Age/Gender Darrel Reach 67 y.o. male Room/Bed: 018C/018C  Code Status   Code Status: Full Code  Home/SNF/Other Home Patient oriented to: self, place, time, and situation Is this baseline? Yes   Triage Complete: Triage complete  Chief Complaint Hyponatremia [E87.1]  Triage Note Pt BIB [facility] as a cide stroke. Pt [place] way [place]. Pt became confused, was not able to answer his wife's questions. Symptoms are resolved upon hospital arrival. VSS.    Allergies Allergies  Allergen Reactions   Azithromycin Other (See Comments)    Stomach cramps     Level of Care/Admitting Diagnosis ED Disposition     ED Disposition  Admit   Condition  --   Comment  Hospital Area: [facility] [[id number]]  Level of Care: Progressive [102]  Admit to Progressive based on following criteria: NEPHROLOGY stable condition requiring close monitoring for AKI, requiring Hemodialysis or Peritoneal Dialysis either from expected electrolyte imbalance, acidosis, or fluid overload that can be managed by NIPPV or high flow oxygen.  May place patient in observation at [facility] or [facility] if equivalent level of care is available:: Yes  Covid Evaluation: Asymptomatic - no recent exposure (last 10 days) testing not required  Diagnosis: Hyponatremia [454098]  Admitting Physician: Orland Mustard [[id number]]  Attending Physician: Orland Mustard [[id number]]          B Medical/Surgery History Past Medical History:  Diagnosis Date   Anxiety    Cervical disc disease    Coronary artery disease    GERD (gastroesophageal reflux disease)    triggers by eating spicy food per pt   Raynaud's disease    Vitamin B 12 deficiency    Vitamin D deficiency    Past Surgical History:  Procedure Laterality Date   CARDIAC CATHETERIZATION     COLONOSCOPY  2011    Johnson-normal exam per pt   CORONARY IMAGING/OCT N/A 11/18/2020   Procedure: INTRAVASCULAR IMAGING/OCT;  Surgeon: Iran Ouch, MD;  Location: [facility] INVASIVE CV LAB;  Service: Cardiovascular;  Laterality: N/A;   CORONARY IMAGING/OCT N/A 12/16/2020   Procedure: INTRAVASCULAR IMAGING/OCT;  Surgeon: Iran Ouch, MD;  Location: [facility] INVASIVE CV LAB;  Service: Cardiovascular;  Laterality: N/A;   CORONARY STENT INTERVENTION  11/18/2020   CORONARY STENT INTERVENTION N/A 11/18/2020   Procedure: CORONARY STENT INTERVENTION;  Surgeon: Iran Ouch, MD;  Location: [facility] CV [facility];  Service: Cardiovascular;  Laterality: N/A;   CORONARY STENT INTERVENTION N/A 12/16/2020   Procedure: CORONARY STENT INTERVENTION;  Surgeon: Iran Ouch, MD;  Location: [facility]VASIVE CV LAB;  Service: Cardiovascular;  Laterality: N/A;   infertility surgery     LEFT HEART CATH AND CORONARY ANGIOGRAPHY N/A 11/18/2020   Procedure: LEFT HEART CATH AND CORONARY ANGIOGRAPHY;  Surgeon: Iran Ouch, MD;  Location: [facility]VAS[facility] CV LAB;  Service: Cardiovascular;  Laterality: N/A;   NECK SURGERY  2007   UPPER GASTROINTESTINAL ENDOSCOPY  2020   Ganem-h.pylori per pt     A IV Location/Drains/Wounds Patient Lines/Drains/Airways Status     Active Line/Drains/Airways     None            Intake/Output Last 24 hours No intake or output data in the 24 hours ending 05/05/23 1614  Labs/Imaging Results for orders placed or performed during the hospital encounter of 05/05/23 (from the past  48 hour(s))  CBG monitoring, ED     Status: Abnormal   Collection Time: 05/05/23 10:25 AM  Result Value Ref Range   Glucose-Capillary 107 (H) 70 - 99 mg/dL    Comment: Glucose reference range applies only to samples taken after fasting for at least 8 hours.  Ethanol     Status: None   Collection Time: 05/05/23 10:26 AM  Result Value Ref Range   Alcohol, Ethyl (B) <10 <10 mg/dL    Comment: (NOTE) Lowest detectable  limit for serum alcohol is 10 mg/dL.  For medical purposes only. Performed at [facility], [address] [place], [place] [address]   CBC     Status: Abnormal   Collection Time: 05/05/23 10:26 AM  Result Value Ref Range   WBC 4.2 4.0 - 10.5 K/uL   RBC 4.05 (L) 4.22 - 5.81 MIL/uL   Hemoglobin 12.8 (L) 13.0 - 17.0 g/dL   HCT 21.3 (L) 08.6 - 57.8 %   MCV 90.1 80.0 - 100.0 fL   MCH 31.6 26.0 - 34.0 pg   MCHC 35.1 30.0 - 36.0 g/dL   RDW 46.9 62.9 - 52.8 %   Platelets 136 (L) 150 - 400 K/uL    Comment: REPEATED TO VERIFY   nRBC 0.0 0.0 - 0.2 %    Comment: Performed at [facility], [address] [place], [place] [address]  Differential     Status: None   Collection Time: 05/05/23 10:26 AM  Result Value Ref Range   Neutrophils Relative % 61 %   Neutro Abs 2.6 1.7 - 7.7 K/uL   Lymphocytes Relative 29 %   Lymphs Abs 1.2 0.7 - 4.0 K/uL   Monocytes Relative 8 %   Monocytes Absolute 0.3 0.1 - 1.0 K/uL   Eosinophils Relative 1 %   Eosinophils Absolute 0.0 0.0 - 0.5 K/uL   Basophils Relative 1 %   Basophils Absolute 0.0 0.0 - 0.1 K/uL   Immature Granulocytes 0 %   Abs Immature Granulocytes 0.01 0.00 - 0.07 K/uL    Comment: Performed at [facility], [address] [place], [place] [address]  Comprehensive metabolic panel     Status: Abnormal   Collection Time: 05/05/23 10:26 AM  Result Value Ref Range   Sodium 124 (L) 135 - 145 mmol/L   Potassium 4.7 3.5 - 5.1 mmol/L    Comment: HEMOLYSIS AT THIS LEVEL MAY AFFECT RESULT   Chloride 91 (L) 98 - 111 mmol/L   CO2 23 22 - 32 mmol/L   Glucose, Bld 115 (H) 70 - 99 mg/dL    Comment: Glucose reference range applies only to samples taken after fasting for at least 8 hours.   BUN 12 8 - 23 mg/dL   Creatinine, Ser 7.25 0.61 - 1.24 mg/dL   Calcium 8.4 (L) 8.9 - 10.3 mg/dL   Total Protein 5.8 (L) 6.5 - 8.1 g/dL   Albumin 3.8 3.5 - 5.0 g/dL   AST 24 15 - 41 U/L    Comment: HEMOLYSIS AT THIS LEVEL MAY AFFECT RESULT    ALT 18 0 - 44 U/L    Comment: HEMOLYSIS AT THIS LEVEL MAY AFFECT RESULT   Alkaline Phosphatase 23 (L) 38 - 126 U/L   Total Bilirubin 1.7 (H) 0.3 - 1.2 mg/dL    Comment: HEMOLYSIS AT THIS LEVEL MAY AFFECT RESULT   GFR, Estimated >60 >60 mL/min    Comment: (NOTE) Calculated using the CKD-EPI Creatinine Equation (2021)  Anion gap 10 5 - 15    Comment: Performed at [facility], [address] [place], [place] [address]  I-stat chem 8, ED     Status: Abnormal   Collection Time: 05/05/23 10:34 AM  Result Value Ref Range   Sodium 126 (L) 135 - 145 mmol/L   Potassium 4.4 3.5 - 5.1 mmol/L   Chloride 94 (L) 98 - 111 mmol/L   BUN 13 8 - 23 mg/dL   Creatinine, Ser 6.04 0.61 - 1.24 mg/dL   Glucose, Bld 540 (H) 70 - 99 mg/dL    Comment: Glucose reference range applies only to samples taken after fasting for at least 8 hours.   Calcium, Ion 1.02 (L) 1.15 - 1.40 mmol/L   TCO2 24 22 - 32 mmol/L   Hemoglobin 11.6 (L) 13.0 - 17.0 g/dL   HCT 98.1 (L) 19.1 - 47.8 %   MR BRAIN WO CONTRAST  Result Date: 05/05/2023 CLINICAL DATA:  67 year old male code stroke presentation today. Confusion. Atrial flutter on Eliquis. EXAM: MRI HEAD WITHOUT CONTRAST TECHNIQUE: Multiplanar, multiecho pulse sequences of the brain and surrounding structures were obtained without intravenous contrast. COMPARISON:  Head CT 1033 hours. FINDINGS: Brain: No restricted diffusion to suggest acute infarction. No midline shift, mass effect, evidence of mass lesion, ventriculomegaly, extra-axial collection or acute intracranial hemorrhage. Cervicomedullary junction and pituitary are within normal limits. Largely normal for age gray and white matter signal throughout the brain. Minimal nonspecific white matter T2 and FLAIR hyperintensity, such as on series 6, image 20. No cortical encephalomalacia. No chronic cerebral blood products. Deep gray nuclei, brainstem and cerebellum appear negative. Vascular: Major intracranial vascular flow  voids are preserved. Skull and upper cervical spine: Negative for age visible cervical spine. Visualized bone marrow signal is within normal limits. Sinuses/Orbits: Negative. Other: Visible internal auditory structures appear normal. Negative visible scalp and face. IMPRESSION: No acute intracranial abnormality. Largely normal for age noncontrast MRI appearance of the Brain. Electronically Signed   By: Odessa Fleming M.D.   On: 05/05/2023 12:23   CT ANGIO HEAD NECK W WO CM  Result Date: 05/05/2023 CLINICAL DATA:  Stroke/TIA EXAM: CT ANGIOGRAPHY HEAD AND NECK WITH AND WITHOUT CONTRAST TECHNIQUE: Multidetector CT imaging of the head and neck was performed using the standard protocol during bolus administration of intravenous contrast. Multiplanar CT image reconstructions and MIPs were obtained to evaluate the vascular anatomy. Carotid stenosis measurements (when applicable) are obtained utilizing NASCET criteria, using the distal internal carotid diameter as the denominator. RADIATION DOSE REDUCTION: This exam was performed according to the departmental dose-optimization program which includes automated exposure control, adjustment of the mA and/or kV according to patient size and/or use of iterative reconstruction technique. CONTRAST:  75mL OMNIPAQUE IOHEXOL 350 MG/ML SOLN COMPARISON:  Same-day noncontrast CT head FINDINGS: CTA NECK FINDINGS Aortic arch: The imaged aortic arch is normal. The origins of the major branch vessels are patent. The subclavian arteries are patent to the level imaged. Right carotid system: The right common, internal, and external carotid arteries are patent, without hemodynamically significant stenosis or occlusion there is no evidence of dissection or aneurysm. Left carotid system: The left common, internal, and external carotid arteries are patent, without hemodynamically significant stenosis or occlusion. There is no evidence of dissection or aneurysm. Vertebral arteries: The vertebral  arteries are patent, without hemodynamically significant stenosis or occlusion there is no evidence of dissection or aneurysm. Skeleton: There is no acute osseous abnormality or suspicious osseous lesion. There is no  visible canal hematoma. The patient is status post C4 through C7 ACDF with solid osseous fusion. Other neck: The soft tissues of the neck are unremarkable. Upper chest: The imaged lung apices are clear. Review of the MIP images confirms the above findings CTA HEAD FINDINGS Anterior circulation: There is mild calcified plaque in the intracranial ICAs resulting in mild stenosis of the paraclinoid segments bilaterally. The bilateral MCAs and ACAS are patent, without proximal stenosis or occlusion. The anterior communicating artery is normal. There is no aneurysm or AVM. Posterior circulation: The bilateral V4 segments are patent. The basilar artery is patent. The major cerebellar arteries appear patent. The bilateral PCAs are patent, without proximal stenosis or occlusion. Right larger than left posterior communicating arteries are identified with fetal origin on the right. There is no aneurysm or AVM. Venous sinuses: Patent. Anatomic variants: As above. Review of the MIP images confirms the above findings IMPRESSION: Patent vasculature of the head and neck with no hemodynamically significant stenosis, occlusion, or dissection. Findings communicated to Dr Iver Nestle at 11:12 am. Electronically Signed   By: Lesia Hausen M.D.   On: 05/05/2023 11:13   CT HEAD CODE STROKE WO CONTRAST  Result Date: 05/05/2023 CLINICAL DATA:  Code stroke.  67 year old male. EXAM: CT HEAD WITHOUT CONTRAST TECHNIQUE: Contiguous axial images were obtained from the base of the skull through the vertex without intravenous contrast. RADIATION DOSE REDUCTION: This exam was performed according to the departmental dose-optimization program which includes automated exposure control, adjustment of the mA and/or kV according to patient size  and/or use of iterative reconstruction technique. COMPARISON:  None Available. FINDINGS: Brain: Cerebral volume is within normal limits for age. No midline shift, ventriculomegaly, mass effect, evidence of mass lesion, intracranial hemorrhage or evidence of cortically based acute infarction. Gray-white matter differentiation is within normal limits throughout the brain. No encephalomalacia identified. Incidental dural calcification along the falx. Vascular: No suspicious intracranial vascular hyperdensity. Skull: No acute osseous abnormality identified. Sinuses/Orbits: Mucosal thickening or mucous retention cysts at the right maxillary alveolar recess. Otherwise clear. Other: Visualized orbits and scalp soft tissues are within normal limits. ASPECTS [facility] Stroke Program Early CT Score) Total score (0-10 with 10 being normal): 10 IMPRESSION: 1. Normal for age noncontrast CT appearance of the brain. ASPECTS 10. 2. These results were communicated to Dr. Iver Nestle at 10:37 am on 05/05/2023 by text page via the [facility] messaging system. Electronically Signed   By: Odessa Fleming M.D.   On: 05/05/2023 10:38    Pending Labs Unresulted Labs (From admission, onward)     Start     Ordered   05/06/23 0500  Basic metabolic panel  Tomorrow morning,   R        05/05/23 1603   05/05/23 1603  HIV Antibody (routine testing w rflx)  (HIV Antibody (Routine testing w reflex) panel)  Once,   R        05/05/23 1603   05/05/23 1529  Cortisol-am, blood  Once,   R        05/05/23 1528   05/05/23 1440  Vitamin B12  Once,   URGENT        05/05/23 1439   05/05/23 1435  Osmolality, urine  Once,   URGENT        05/05/23 1434   05/05/23 1435  Osmolality  Once,   URGENT        05/05/23 1434   05/05/23 1435  TSH  Once,   URGENT  05/05/23 1434   05/05/23 1434  Sodium, urine, random  Once,   URGENT        05/05/23 1434   05/05/23 1200  Protime-INR  Once,   STAT        05/05/23 1200   05/05/23 1200  APTT  Once,   STAT         05/05/23 1200   05/05/23 1026  Urine rapid drug screen (hosp performed)  Once,   STAT        05/05/23 1026   05/05/23 1026  Urinalysis, Routine w reflex microscopic -Urine, Clean Catch  Once,   URGENT       Question:  Specimen Source  Answer:  Urine, Clean Catch   05/05/23 1026            Vitals/Pain Today's Vitals   05/05/23 1451 05/05/23 1515 05/05/23 1528 05/05/23 1600  BP:      Pulse: 72 75 81 83  Resp: 17 19 20 16   Temp:      TempSrc:      SpO2: 100% 100% 100% 100%  Weight:        Isolation Precautions No active isolations  Medications Medications  acetaminophen (TYLENOL) tablet 650 mg (has no administration in time range)    Or  acetaminophen (TYLENOL) suppository 650 mg (has no administration in time range)  traZODone (DESYREL) tablet 25 mg (has no administration in time range)  iohexol (OMNIPAQUE) 350 MG/ML injection 75 mL (75 mLs Intravenous Contrast Given 05/05/23 1043)    Mobility walks     Focused Assessments    R Recommendations: See Admitting Provider Note  Report given to:   Additional Notes:

## 2023-05-05 NOTE — Assessment & Plan Note (Signed)
Episode lasted 5 minutes in the car with some numbness in his left hand which happens when he is anxious CT head and MRI brain with no acute finding Neurology consulted in ED with normal MRI/CTA no further neuro w/u Already optimized on eliquis/crestor  Will keep on tele Check B12 and w/u hyponatremia Anxiety w/u/treatment and insomnia treatment

## 2023-05-05 NOTE — Assessment & Plan Note (Signed)
Sodium two years ago 132 Then 7/9: 127 and then 7/17: 131 Now 124 Unsure if contributing to symptoms but will do w/u with urine studies,plasma osmolality, TSH He does not drink excessive water, alcohol and is on no medication with AE of hyponatremia  Will check AM cortisol, but addison's low on my differential  Strict I/O

## 2023-05-05 NOTE — ED Triage Notes (Signed)
Pt BIB [facility] as a cide stroke. Pt [place]. Pt became confused, was not able to answer his wife's questions. Symptoms are resolved upon hospital arrival. VSS.

## 2023-05-05 NOTE — ED Provider Notes (Signed)
[facility] Provider Note  CSN: [id number] Arrival date & time: 05/05/23 1023  Chief Complaint(s) Code Stroke  HPI Christopher [name] is a 67 y.o. male with PMH A-flutter on Eliquis, CAD, anxiety, B12 deficiency, Raynaud's disease who presents emergency department as a code stroke for speech difficulty and confusion.  Wife states that while on the way home from the farmers [place], patient became acutely confused and unable to answer his wife's question.  Here in the emergency room, symptoms have resolved outside of some mild dysarthria.  Denies chest pain, shortness of breath, abdominal pain, nausea, vomiting or other systemic symptoms.   Past Medical History Past Medical History:  Diagnosis Date   Anxiety    Cervical disc disease    Coronary artery disease    GERD (gastroesophageal reflux disease)    triggers by eating spicy food per pt   Raynaud's disease    Vitamin B 12 deficiency    Vitamin D deficiency    Patient Active Problem List   Diagnosis Date Noted   Paroxysmal atrial fibrillation (HCC) 05/05/2023   Anxiety 05/05/2023   Hyponatremia 05/05/2023   Insomnia 05/05/2023   transient dysarthria 05/05/2023   Heat intolerance 05/05/2023   Raynaud disease 12/16/2020   CAD (coronary artery disease) 11/19/2020   Dyslipidemia 11/19/2020   Unstable angina (HCC) 11/18/2020   LOW BACK PAIN 06/24/2007   Home Medication(s) Prior to Admission medications   Medication Sig Start Date End Date Taking? Authorizing Provider  ALPRAZolam (XANAX) 0.25 MG tablet Take 1 tablet (0.25 mg total) by mouth at bedtime as needed for anxiety. 04/10/23  Yes Iran Ouch, MD  apixaban (ELIQUIS) 5 MG TABS tablet Take 1 tablet (5 mg total) by mouth 2 (two) times daily. 04/02/23 05/05/23 Yes Charlynne Pander, MD  nitroGLYCERIN (NITROSTAT) 0.4 MG SL tablet DISSOLVE ONE TABLET UNDER TONGUE EVERY 5 MINUTES AS NEEDED FOR CHEST PAIN 03/22/23  Yes Iran Ouch, MD  ranolazine  (RANEXA) 1000 MG SR tablet Take 1 tablet (1,000 mg total) by mouth 2 (two) times daily. 08/21/22  Yes Iran Ouch, MD  rosuvastatin (CRESTOR) 20 MG tablet Take 1 tablet (20 mg total) by mouth daily. 08/21/22  Yes Iran Ouch, MD                                                                                                                                    Past Surgical History Past Surgical History:  Procedure Laterality Date   CARDIAC CATHETERIZATION     COLONOSCOPY  2011   Johnson-normal exam per pt   CORONARY IMAGING/OCT N/A 11/18/2020   Procedure: INTRAVASCULAR IMAGING/OCT;  Surgeon: Iran Ouch, MD;  Location: [facility];  Service: Cardiovascular;  Laterality: N/A;   CORONARY IMAGING/OCT N/A 12/16/2020   Procedure: INTRAVASCULAR IMAGING/OCT;  Surgeon: Iran Ouch, MD;  Location: [facility];  Service: Cardiovascular;  Laterality: N/A;   CORONARY STENT INTERVENTION  11/18/2020   CORONARY STENT INTERVENTION N/A 11/18/2020   Procedure: CORONARY STENT INTERVENTION;  Surgeon: Iran Ouch, MD;  Location: [facility];  Service: Cardiovascular;  Laterality: N/A;   CORONARY STENT INTERVENTION N/A 12/16/2020   Procedure: CORONARY STENT INTERVENTION;  Surgeon: Iran Ouch, MD;  Location: [facility];  Service: Cardiovascular;  Laterality: N/A;   infertility surgery     LEFT HEART CATH AND CORONARY ANGIOGRAPHY N/A 11/18/2020   Procedure: LEFT HEART CATH AND CORONARY ANGIOGRAPHY;  Surgeon: Iran Ouch, MD;  Location: [facility];  Service: Cardiovascular;  Laterality: N/A;   NECK SURGERY  2007   UPPER GASTROINTESTINAL ENDOSCOPY  2020   Ganem-h.pylori per pt   Family History Family History  Problem Relation Age of Onset   Hyperlipidemia Mother    Hypertension Mother    Heart disease Mother 56       CABG x 3    Diabetes Mother    Stroke Father    CAD Father    Colon cancer Neg Hx    Colon polyps Neg Hx     Esophageal cancer Neg Hx    Rectal cancer Neg Hx    Stomach cancer Neg Hx     Social History Social History   Tobacco Use   Smoking status: Never   Smokeless tobacco: Never  Vaping Use   Vaping status: Never Used  Substance Use Topics   Alcohol use: Not Currently   Drug use: Not Currently   Allergies Azithromycin  Review of Systems Review of Systems  Neurological:  Positive for speech difficulty.  Psychiatric/Behavioral:  Positive for confusion.     Physical Exam Vital Signs  I have reviewed the triage vital signs BP 134/69   Pulse 88   Temp 97.8 F (36.6 C) (Oral)   Resp 14   Wt 60.2 kg   SpO2 100%   BMI 20.18 kg/m   Physical Exam Constitutional:      General: He is not in acute distress.    Appearance: Normal appearance.  HENT:     Head: Normocephalic and atraumatic.     Nose: No congestion or rhinorrhea.  Eyes:     General:        Right eye: No discharge.        Left eye: No discharge.     Extraocular Movements: Extraocular movements intact.     Pupils: Pupils are equal, round, and reactive to light.  Cardiovascular:     Rate and Rhythm: Normal rate and regular rhythm.     Heart sounds: No murmur heard. Pulmonary:     Effort: No respiratory distress.     Breath sounds: No wheezing or rales.  Abdominal:     General: There is no distension.     Tenderness: There is no abdominal tenderness.  Musculoskeletal:        General: Normal range of motion.     Cervical back: Normal range of motion.  Skin:    General: Skin is warm and dry.  Neurological:     General: No focal deficit present.     Mental Status: He is alert.     Cranial Nerves: No cranial nerve deficit.     Sensory: No sensory deficit.     Motor: No weakness.     ED Results and Treatments Labs (all labs ordered are listed, but only abnormal results are displayed) Labs Reviewed  CBC -  Abnormal; Notable for the following components:      Result Value   RBC 4.05 (*)    Hemoglobin  12.8 (*)    HCT 36.5 (*)    Platelets 136 (*)    All other components within normal limits  COMPREHENSIVE METABOLIC PANEL - Abnormal; Notable for the following components:   Sodium 124 (*)    Chloride 91 (*)    Glucose, Bld 115 (*)    Calcium 8.4 (*)    Total Protein 5.8 (*)    Alkaline Phosphatase 23 (*)    Total Bilirubin 1.7 (*)    All other components within normal limits  APTT - Abnormal; Notable for the following components:   aPTT 37 (*)    All other components within normal limits  I-STAT CHEM 8, ED - Abnormal; Notable for the following components:   Sodium 126 (*)    Chloride 94 (*)    Glucose, Bld 109 (*)    Calcium, Ion 1.02 (*)    Hemoglobin 11.6 (*)    HCT 34.0 (*)    All other components within normal limits  CBG MONITORING, ED - Abnormal; Notable for the following components:   Glucose-Capillary 107 (*)    All other components within normal limits  ETHANOL  DIFFERENTIAL  PROTIME-INR  SODIUM, URINE, RANDOM  TSH  VITAMIN B12  RAPID URINE DRUG SCREEN, HOSP PERFORMED  URINALYSIS, ROUTINE W REFLEX MICROSCOPIC  OSMOLALITY, URINE  OSMOLALITY  CORTISOL-AM, BLOOD  HIV ANTIBODY (ROUTINE TESTING W REFLEX)  BASIC METABOLIC PANEL                                                                                                                          Radiology MR BRAIN WO CONTRAST  Result Date: 05/05/2023 CLINICAL DATA:  67 year old male code stroke presentation today. Confusion. Atrial flutter on Eliquis. EXAM: MRI HEAD WITHOUT CONTRAST TECHNIQUE: Multiplanar, multiecho pulse sequences of the brain and surrounding structures were obtained without intravenous contrast. COMPARISON:  Head CT 1033 hours. FINDINGS: Brain: No restricted diffusion to suggest acute infarction. No midline shift, mass effect, evidence of mass lesion, ventriculomegaly, extra-axial collection or acute intracranial hemorrhage. Cervicomedullary junction and pituitary are within normal limits. Largely  normal for age gray and white matter signal throughout the brain. Minimal nonspecific white matter T2 and FLAIR hyperintensity, such as on series 6, image 20. No cortical encephalomalacia. No chronic cerebral blood products. Deep gray nuclei, brainstem and cerebellum appear negative. Vascular: Major intracranial vascular flow voids are preserved. Skull and upper cervical spine: Negative for age visible cervical spine. Visualized bone marrow signal is within normal limits. Sinuses/Orbits: Negative. Other: Visible internal auditory structures appear normal. Negative visible scalp and face. IMPRESSION: No acute intracranial abnormality. Largely normal for age noncontrast MRI appearance of the Brain. Electronically Signed   By: Odessa Fleming M.D.   On: 05/05/2023 12:23   CT ANGIO HEAD NECK W WO CM  Result Date: 05/05/2023 CLINICAL DATA:  Stroke/TIA EXAM:  CT ANGIOGRAPHY HEAD AND NECK WITH AND WITHOUT CONTRAST TECHNIQUE: Multidetector CT imaging of the head and neck was performed using the standard protocol during bolus administration of intravenous contrast. Multiplanar CT image reconstructions and MIPs were obtained to evaluate the vascular anatomy. Carotid stenosis measurements (when applicable) are obtained utilizing NASCET criteria, using the distal internal carotid diameter as the denominator. RADIATION DOSE REDUCTION: This exam was performed according to the departmental dose-optimization program which includes automated exposure control, adjustment of the mA and/or kV according to patient size and/or use of iterative reconstruction technique. CONTRAST:  75mL OMNIPAQUE IOHEXOL 350 MG/ML SOLN COMPARISON:  Same-day noncontrast CT head FINDINGS: CTA NECK FINDINGS Aortic arch: The imaged aortic arch is normal. The origins of the major branch vessels are patent. The subclavian arteries are patent to the level imaged. Right carotid system: The right common, internal, and external carotid arteries are patent, without  hemodynamically significant stenosis or occlusion there is no evidence of dissection or aneurysm. Left carotid system: The left common, internal, and external carotid arteries are patent, without hemodynamically significant stenosis or occlusion. There is no evidence of dissection or aneurysm. Vertebral arteries: The vertebral arteries are patent, without hemodynamically significant stenosis or occlusion there is no evidence of dissection or aneurysm. Skeleton: There is no acute osseous abnormality or suspicious osseous lesion. There is no visible canal hematoma. The patient is status post C4 through C7 ACDF with solid osseous fusion. Other neck: The soft tissues of the neck are unremarkable. Upper chest: The imaged lung apices are clear. Review of the MIP images confirms the above findings CTA HEAD FINDINGS Anterior circulation: There is mild calcified plaque in the intracranial ICAs resulting in mild stenosis of the paraclinoid segments bilaterally. The bilateral MCAs and ACAS are patent, without proximal stenosis or occlusion. The anterior communicating artery is normal. There is no aneurysm or AVM. Posterior circulation: The bilateral V4 segments are patent. The basilar artery is patent. The major cerebellar arteries appear patent. The bilateral PCAs are patent, without proximal stenosis or occlusion. Right larger than left posterior communicating arteries are identified with fetal origin on the right. There is no aneurysm or AVM. Venous sinuses: Patent. Anatomic variants: As above. Review of the MIP images confirms the above findings IMPRESSION: Patent vasculature of the head and neck with no hemodynamically significant stenosis, occlusion, or dissection. Findings communicated to Dr Iver Nestle at 11:12 am. Electronically Signed   By: Lesia Hausen M.D.   On: 05/05/2023 11:13   CT HEAD CODE STROKE WO CONTRAST  Result Date: 05/05/2023 CLINICAL DATA:  Code stroke.  67 year old male. EXAM: CT HEAD WITHOUT CONTRAST  TECHNIQUE: Contiguous axial images were obtained from the base of the skull through the vertex without intravenous contrast. RADIATION DOSE REDUCTION: This exam was performed according to the departmental dose-optimization program which includes automated exposure control, adjustment of the mA and/or kV according to patient size and/or use of iterative reconstruction technique. COMPARISON:  None Available. FINDINGS: Brain: Cerebral volume is within normal limits for age. No midline shift, ventriculomegaly, mass effect, evidence of mass lesion, intracranial hemorrhage or evidence of cortically based acute infarction. Gray-white matter differentiation is within normal limits throughout the brain. No encephalomalacia identified. Incidental dural calcification along the falx. Vascular: No suspicious intracranial vascular hyperdensity. Skull: No acute osseous abnormality identified. Sinuses/Orbits: Mucosal thickening or mucous retention cysts at the right maxillary alveolar recess. Otherwise clear. Other: Visualized orbits and scalp soft tissues are within normal limits. ASPECTS [facility] Stroke Program Early CT Score) Total  score (0-10 with 10 being normal): 10 IMPRESSION: 1. Normal for age noncontrast CT appearance of the brain. ASPECTS 10. 2. These results were communicated to Dr. Iver Nestle at 10:37 am on 05/05/2023 by text page via the [facility] messaging system. Electronically Signed   By: Odessa Fleming M.D.   On: 05/05/2023 10:38    Pertinent labs & imaging results that were available during my care of the patient were reviewed by me and considered in my medical decision making (see MDM for details).  Medications Ordered in ED Medications  acetaminophen (TYLENOL) tablet 650 mg (has no administration in time range)    Or  acetaminophen (TYLENOL) suppository 650 mg (has no administration in time range)  traZODone (DESYREL) tablet 25 mg (has no administration in time range)  iohexol (OMNIPAQUE) 350 MG/ML injection 75 mL  (75 mLs Intravenous Contrast Given 05/05/23 1043)                                                                                                                                     Procedures Procedures  (including critical care time)  Medical Decision Making / ED Course   This patient presents to the ED for concern of confusion, speech difficulty, this involves an extensive number of treatment options, and is a complaint that carries with it a high risk of complications and morbidity.  The differential diagnosis includes CVA, hemorrhagic stroke, mass, Todd's paralysis, seizure, electrolyte abnormality, encephalopathy  MDM: Patient seen emergency room for evaluation of transient alteration of awareness, speech difficulty.  Patient arrives as a stroke alert for dysarthria and I met the stroke neurologist Dr. Iver Nestle in the CT scanner.  Initial stroke imaging is negative and TNK was considered but with low NIH and symptoms ultimately resolved in the emergency department, we did not administer TNK.  Stroke CT head, CT angio and ultimately brain MRI were negative.  Laboratory evaluation however shows a significant hyponatremia to 124 which may explain the patient's transient episode of alteration of awareness.  Hemoglobin is 12.8.  From chart review, this is a significant decrease from his baseline and patient require hospital admission for further workup of his symptomatic hyponatremia   Additional history obtained: -Additional history obtained from wife -External records from outside source obtained and reviewed including: Chart review including previous notes, labs, imaging, consultation notes   Lab Tests: -I ordered, reviewed, and interpreted labs.   The pertinent results include:   Labs Reviewed  CBC - Abnormal; Notable for the following components:      Result Value   RBC 4.05 (*)    Hemoglobin 12.8 (*)    HCT 36.5 (*)    Platelets 136 (*)    All other components within normal limits   COMPREHENSIVE METABOLIC PANEL - Abnormal; Notable for the following components:   Sodium 124 (*)    Chloride 91 (*)    Glucose, Bld 115 (*)  Calcium 8.4 (*)    Total Protein 5.8 (*)    Alkaline Phosphatase 23 (*)    Total Bilirubin 1.7 (*)    All other components within normal limits  APTT - Abnormal; Notable for the following components:   aPTT 37 (*)    All other components within normal limits  I-STAT CHEM 8, ED - Abnormal; Notable for the following components:   Sodium 126 (*)    Chloride 94 (*)    Glucose, Bld 109 (*)    Calcium, Ion 1.02 (*)    Hemoglobin 11.6 (*)    HCT 34.0 (*)    All other components within normal limits  CBG MONITORING, ED - Abnormal; Notable for the following components:   Glucose-Capillary 107 (*)    All other components within normal limits  ETHANOL  DIFFERENTIAL  PROTIME-INR  SODIUM, URINE, RANDOM  TSH  VITAMIN B12  RAPID URINE DRUG SCREEN, HOSP PERFORMED  URINALYSIS, ROUTINE W REFLEX MICROSCOPIC  OSMOLALITY, URINE  OSMOLALITY  CORTISOL-AM, BLOOD  HIV ANTIBODY (ROUTINE TESTING W REFLEX)  BASIC METABOLIC PANEL        Imaging Studies ordered: I ordered imaging studies including CT head, CT angio brain and neck, MRI brain I independently visualized and interpreted imaging. I agree with the radiologist interpretation   Medicines ordered and prescription drug management: Meds ordered this encounter  Medications   iohexol (OMNIPAQUE) 350 MG/ML injection 75 mL   OR Linked Order Group    acetaminophen (TYLENOL) tablet 650 mg    acetaminophen (TYLENOL) suppository 650 mg   traZODone (DESYREL) tablet 25 mg    -I have reviewed the patients home medicines and have made adjustments as needed  Critical interventions Stroke alert and consideration of TNK  Consultations Obtained: I requested consultation with the stroke neurologist Dr. Iver Nestle,  and discussed lab and imaging findings as well as pertinent plan - they recommend: No TNK,  further medical workup   Cardiac Monitoring: The patient was maintained on a cardiac monitor.  I personally viewed and interpreted the cardiac monitored which showed an underlying rhythm of: NSR  Social Determinants of Health:  Factors impacting patients care include: none   Reevaluation: After the interventions noted above, I reevaluated the patient and found that they have :improved  Co morbidities that complicate the patient evaluation  Past Medical History:  Diagnosis Date   Anxiety    Cervical disc disease    Coronary artery disease    GERD (gastroesophageal reflux disease)    triggers by eating spicy food per pt   Raynaud's disease    Vitamin B 12 deficiency    Vitamin D deficiency       Dispostion: I considered admission for this patient, and given symptomatic hyponatremia patient require hospital admission     Final Clinical Impression(s) / ED Diagnoses Final diagnoses:  Hyponatremia  Confusion     @PCDICTATION @    Glendora Score, MD 05/05/23 1754

## 2023-05-05 NOTE — Assessment & Plan Note (Signed)
Continue crestor 

## 2023-05-05 NOTE — Assessment & Plan Note (Addendum)
On xanax, but also using for sleep advised against this Possibly affecting his sleep/stress and having physical manifestation with tingling  4-5 years ago he was put on an anti-depressant and it made him suicidal  Discussed optimal management would be with counseling and then SSRI/buspar/etc over BZD first line Would recommend trial hydroxyzine or klonopin over xanax  Discussed he could call pharmacy to see if he can figure out what drug he was on that gave him suicidal ideation if he can't get records from PCP.  TSH normal in July 2024 F/u with PCP

## 2023-05-05 NOTE — Assessment & Plan Note (Addendum)
Differential includes emotional distress, pheochromocytoma, carcinoid, drugs, Ca -he has checked his blood pressure when he was having these episodes and bp has been normal  -he has no wheezing or diarrhea or abdominal pain  -symptoms resolve when he relaxes and is the cool which makes emotional distress more likely  -ranexa could contribute to sweating -could consider imaging -optimize anxiety/sleep

## 2023-05-06 ENCOUNTER — Observation Stay (HOSPITAL_COMMUNITY): Payer: BC Managed Care – PPO

## 2023-05-06 DIAGNOSIS — Z833 Family history of diabetes mellitus: Secondary | ICD-10-CM | POA: Diagnosis not present

## 2023-05-06 DIAGNOSIS — Z955 Presence of coronary angioplasty implant and graft: Secondary | ICD-10-CM | POA: Diagnosis not present

## 2023-05-06 DIAGNOSIS — Z881 Allergy status to other antibiotic agents status: Secondary | ICD-10-CM | POA: Diagnosis not present

## 2023-05-06 DIAGNOSIS — Z823 Family history of stroke: Secondary | ICD-10-CM | POA: Diagnosis not present

## 2023-05-06 DIAGNOSIS — R471 Dysarthria and anarthria: Secondary | ICD-10-CM | POA: Diagnosis present

## 2023-05-06 DIAGNOSIS — I2510 Atherosclerotic heart disease of native coronary artery without angina pectoris: Secondary | ICD-10-CM | POA: Diagnosis present

## 2023-05-06 DIAGNOSIS — E871 Hypo-osmolality and hyponatremia: Secondary | ICD-10-CM | POA: Diagnosis present

## 2023-05-06 DIAGNOSIS — R4182 Altered mental status, unspecified: Secondary | ICD-10-CM | POA: Diagnosis present

## 2023-05-06 DIAGNOSIS — F419 Anxiety disorder, unspecified: Secondary | ICD-10-CM | POA: Diagnosis present

## 2023-05-06 DIAGNOSIS — I471 Supraventricular tachycardia, unspecified: Secondary | ICD-10-CM | POA: Diagnosis not present

## 2023-05-06 DIAGNOSIS — R569 Unspecified convulsions: Secondary | ICD-10-CM

## 2023-05-06 DIAGNOSIS — Z7901 Long term (current) use of anticoagulants: Secondary | ICD-10-CM | POA: Diagnosis not present

## 2023-05-06 DIAGNOSIS — I480 Paroxysmal atrial fibrillation: Secondary | ICD-10-CM | POA: Diagnosis present

## 2023-05-06 DIAGNOSIS — R410 Disorientation, unspecified: Secondary | ICD-10-CM | POA: Diagnosis present

## 2023-05-06 DIAGNOSIS — G4700 Insomnia, unspecified: Secondary | ICD-10-CM | POA: Diagnosis present

## 2023-05-06 DIAGNOSIS — I7300 Raynaud's syndrome without gangrene: Secondary | ICD-10-CM | POA: Diagnosis present

## 2023-05-06 DIAGNOSIS — Z83438 Family history of other disorder of lipoprotein metabolism and other lipidemia: Secondary | ICD-10-CM | POA: Diagnosis not present

## 2023-05-06 DIAGNOSIS — K219 Gastro-esophageal reflux disease without esophagitis: Secondary | ICD-10-CM | POA: Diagnosis present

## 2023-05-06 DIAGNOSIS — E860 Dehydration: Secondary | ICD-10-CM | POA: Diagnosis present

## 2023-05-06 DIAGNOSIS — Z8249 Family history of ischemic heart disease and other diseases of the circulatory system: Secondary | ICD-10-CM | POA: Diagnosis not present

## 2023-05-06 DIAGNOSIS — I951 Orthostatic hypotension: Secondary | ICD-10-CM | POA: Diagnosis present

## 2023-05-06 DIAGNOSIS — Z79899 Other long term (current) drug therapy: Secondary | ICD-10-CM | POA: Diagnosis not present

## 2023-05-06 DIAGNOSIS — E785 Hyperlipidemia, unspecified: Secondary | ICD-10-CM | POA: Diagnosis present

## 2023-05-06 LAB — LIPID PANEL
Cholesterol: 120 mg/dL (ref 0–200)
HDL: 64 mg/dL (ref >40)
LDL Cholesterol: 46 mg/dL (ref 0–99)
Total CHOL/HDL Ratio: 1.9 RATIO
Triglycerides: 48 mg/dL (ref <150)
VLDL: 10 mg/dL (ref 0–40)

## 2023-05-06 LAB — OSMOLALITY, URINE: Osmolality, Ur: 470 mOsm/kg (ref 300–900)

## 2023-05-06 LAB — GLUCOSE, CAPILLARY
Glucose-Capillary: 110 mg/dL — ABNORMAL HIGH (ref 70–99)
Glucose-Capillary: 130 mg/dL — ABNORMAL HIGH (ref 70–99)
Glucose-Capillary: 116 mg/dL — ABNORMAL HIGH (ref 70–99)

## 2023-05-06 LAB — OSMOLALITY: Osmolality: 275 mOsm/kg (ref 275–295)

## 2023-05-06 LAB — CREATININE, URINE, RANDOM: Creatinine, Urine: 88 mg/dL

## 2023-05-06 LAB — HEMOGLOBIN A1C
Hgb A1c MFr Bld: 5.4 % (ref 4.8–5.6)
Mean Plasma Glucose: 108 mg/dL

## 2023-05-06 LAB — URIC ACID: Uric Acid, Serum: 4.2 mg/dL (ref 3.7–8.6)

## 2023-05-06 LAB — SODIUM, URINE, RANDOM: Sodium, Ur: 28 mmol/L

## 2023-05-06 MED ORDER — LACTATED RINGERS IV SOLN: INTRAVENOUS | Status: AC

## 2023-05-06 MED ORDER — SODIUM CHLORIDE 1 G PO TABS
2.0000 g | ORAL_TABLET | Freq: Once | ORAL | Status: AC
  Administered 2023-05-06: 2 g via ORAL
  Filled 2023-05-06: qty 2

## 2023-05-06 MED ORDER — LACTATED RINGERS IV SOLN: INTRAVENOUS | Status: DC

## 2023-05-06 NOTE — Plan of Care (Signed)

## 2023-05-06 NOTE — Evaluation (Signed)
Occupational Therapy Evaluation Patient Details Name: Christopher [name] MRN: [id number] DOB: May 28, 1956 Today's Date: 05/06/2023   History of Present Illness Christopher [name] is a 67 y.o. male who presented as a code stroke due to transient confusion, slurred speech after being in the heat. CT and MRI were negative for acute intracranial abnormality. PMHx:  PAF diagnosis on eliquis,  anxiety, CAD, GERD, HLD, Raynaud's disease   Clinical Impression   Christopher [name] was evaluated s/p the above admission list. He is indep, works and lives with his family at baseline. Upon evaluation the pt was limited by orthostatic hypotension and knowledge on how to manage BP. Overall he was mod I for all mobility and ADLs with cues for safety and compensatory techniques on how to elevate pressure after positional changes. Pt shares that the outside heat exacerbates his symptoms, but he has never noticed concern from the heat of a shower. He also reports he gets exteremly dizzy after he sands up from praying, he has fallen 1 time due to dizziness. Dr. Thedore [name] made aware of orthostatic BPs and current recommendation to try compression stockings. Pt will benefit from continued acute OT services and d/c home with support of family.  Orthostatic BP: Supine: 115/66 Sitting: 118/77 Standing: 96/83 Standing after 2 minutes: 108/67        If plan is discharge home, recommend the following: Assist for transportation;Assistance with cooking/housework    Functional Status Assessment  Patient has had a recent decline in their functional status and demonstrates the ability to make significant improvements in function in a reasonable and predictable amount of time.  Equipment Recommendations  None recommended by OT       Precautions / Restrictions Precautions Precautions: Fall Precaution Comments: orthostatic Restrictions Weight Bearing Restrictions: No      Mobility Bed Mobility Overal bed mobility: Independent                   Transfers Overall transfer level: Independent                 General transfer comment: no AD      Balance Overall balance assessment: No apparent balance deficits (not formally assessed)                                         ADL either performed or assessed with clinical judgement   ADL Overall ADL's : Modified independent                                       General ADL Comments: Overall pt is mod I for ADLs, cues for safety and symptom management provided     Vision Baseline Vision/History: 1 Wears glasses Vision Assessment?: No apparent visual deficits     Perception Perception: Within Functional Limits       Praxis Praxis: WFL       Pertinent Vitals/Pain Pain Assessment Pain Assessment: No/denies pain     Extremity/Trunk Assessment Upper Extremity Assessment Upper Extremity Assessment: Overall WFL for tasks assessed   Lower Extremity Assessment Lower Extremity Assessment: Overall WFL for tasks assessed   Cervical / Trunk Assessment Cervical / Trunk Assessment: Normal   Communication Communication Communication: No apparent difficulties   Cognition Arousal: Alert Behavior During Therapy: Anxious Overall Cognitive Status: Within Functional Limits for tasks  assessed                                 General Comments: Overall WFL, reported beign anxious about his symtpoms and not having answers     General Comments  Orthostatic BP from sitting to standing            Home Living Family/patient expects to be discharged to:: Private residence Living Arrangements: Children;Spouse/significant other Available Help at Discharge: Family;Available PRN/intermittently Type of Home: House Home Access: Stairs to enter [employer] of Steps: 4-5 Entrance Stairs-Rails: Right;Left Home Layout: Two level Alternate Level Stairs-Number of Steps: flight Alternate Level Stairs-Rails:  Right Bathroom Shower/Tub: Producer, television/film/video: Standard     Home Equipment: None          Prior Functioning/Environment Prior Level of Function : Independent/Modified Independent;Working/employed;Driving             Mobility Comments: no AD, one fall due to dizziness this summer 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, indep, fearful to drive due to symptoms        OT Problem List: Decreased activity tolerance;Decreased knowledge of precautions      OT Treatment/Interventions: DME and/or AE instruction;Therapeutic activities;Patient/family education    OT Goals(Current goals can be found in the care plan section) Acute Rehab OT Goals Patient Stated Goal: to find answers OT Goal Formulation: With patient Time For Goal Achievement: 05/20/23 Potential to Achieve Goals: Good ADL Goals Additional ADL Goal #1: Pt will indep don TED compression stockings Additional ADL Goal #2: Pt will independently use compensatory tehcniques to reduce fall risk during functional tasks from orthostatic hypotension  OT Frequency: Min 1X/week       AM-PAC OT "6 Clicks" Daily Activity     Outcome Measure Help from another person eating meals?: None Help from another person taking care of personal grooming?: None Help from another person toileting, which includes using toliet, bedpan, or urinal?: None Help from another person bathing (including washing, rinsing, drying)?: None Help from another person to put on and taking off regular upper body clothing?: None Help from another person to put on and taking off regular lower body clothing?: None 6 Click Score: 24   End of Session Nurse Communication: Mobility status  Activity Tolerance: Patient tolerated treatment well Patient left: in chair;with call bell/phone within reach  OT Visit Diagnosis: History of falling (Z91.81);Dizziness and giddiness (R42)                Time: 7425-9563 OT Time Calculation (min): 21  min Charges:  OT General Charges $OT Visit: 1 Visit OT Evaluation $OT Eval Moderate Complexity: 1 Mod  Derenda Mis, OTR/L Acute Rehabilitation Services Office [phone] Secure Chat Communication Preferred   Donia Pounds 05/06/2023, 9:22 AM

## 2023-05-06 NOTE — Progress Notes (Signed)
SLP Cancellation Note  Patient Details Name: MERRIC ANSARI MRN: [id number] DOB: Aug 13, 1956   Cancelled treatment:       Reason Eval/Treat Not Completed: Patient at procedure or test/unavailable (getting set up in room for EEG). Will f/u as able.     Mahala Menghini., M.A. CCC-SLP Acute Rehabilitation Services Office [phone]  Secure chat preferred  05/06/2023, 9:44 AM

## 2023-05-06 NOTE — Evaluation (Signed)
Physical Therapy Evaluation Patient Details Name: Christopher [name] MRN: [id number] DOB: [birth year] Today's Date: 05/06/2023  History of Present Illness  Christopher [name] is a 67 y.o. male who presented as a code stroke due to transient confusion, slurred speech after being in the heat. CT and MRI were negative for acute intracranial abnormality. PMHx:  PAF diagnosis on eliquis,  anxiety, CAD, GERD, HLD, Raynaud's disease  Clinical Impression  Pt is presenting close to baseline with functional mobility. Pt continues with feelings of lightheadedness, nausea and dizziness with quick changes in positioning. Pt was educated on muscle pump activity, compression and pacing with position changes to help due to pt was orthostatic earlier with occupational therapy. Modified vestibular evaluation was positive for symptoms present with vertical head movements for VOR and VOR cancellation and negative for horizontal movements. Pt had mild 2 beat nystagmus with L gaze. Due to pt current functional status, home set up and current symptoms recommending skilled physical therapy services 3x/weekly on discharge with a vestibular physical therapist in order to address symptoms.         Equipment Recommendations None recommended by PT     Functional Status Assessment Patient has not had a recent decline in their functional status     Precautions / Restrictions Precautions Precautions: Fall Precaution Comments: orthostatic Restrictions Weight Bearing Restrictions: No      Mobility  Bed Mobility Overal bed mobility: Independent               Patient Response: Anxious, Cooperative  Transfers Overall transfer level: Independent                 General transfer comment: no AD    Ambulation/Gait Ambulation/Gait assistance: Independent Gait Distance (Feet): 20 Feet Assistive device: None Gait Pattern/deviations: WFL(Within Functional Limits), Decreased step length - right, Decreased step length  - left, Narrow base of support       General Gait Details: Pt is cautious with gait, not focus of session due to pt ambulated with OT    Tilt Bed Tilt Bed Patient Response: Anxious, Cooperative      Balance Overall balance assessment: Mild deficits observed, not formally tested         Pertinent Vitals/Pain Pain Assessment Pain Assessment: No/denies pain    Home Living Family/patient expects to be discharged to:: Private residence Living Arrangements: Children;Spouse/significant other Available Help at Discharge: Family;Available PRN/intermittently Type of Home: House Home Access: Stairs to enter Entrance Stairs-Rails: Right;Left Entrance Stairs-Number of Steps: 4-5 Alternate Level Stairs-Number of Steps: flight Home Layout: Two level Home Equipment: None      Prior Function Prior Level of Function : Independent/Modified Independent;Working/employed;Driving             Mobility Comments: no AD, one fall due to dizziness this summer 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, indep, fearful to drive due to symptoms     Extremity/Trunk Assessment   Upper Extremity Assessment Upper Extremity Assessment: Defer to OT evaluation    Lower Extremity Assessment Lower Extremity Assessment: Overall WFL for tasks assessed    Cervical / Trunk Assessment Cervical / Trunk Assessment: Normal  Communication   Communication Communication: No apparent difficulties  Cognition Arousal: Alert Behavior During Therapy: Anxious, WFL for tasks assessed/performed Overall Cognitive Status: Within Functional Limits for tasks assessed          General Comments General comments (skin integrity, edema, etc.): Modified vestibular assessment due to EEG arrived and will wait until after EEG assessment  to finish. Pt Positive for VOR and VOR cancellation with vertical head movements. Negative with horizontal movement. Mild 2 beat nystamus with eye movement to the L in the  horizontal and to the diagonal. Pt educated on compression stockings including various compression strengths 15-20 vs. 20-30 mmHG. Pt was educated on calf raises to improve muscle pump activity and elbow pumping in order to bring BP back up prior to position changes. Pt stated understanding and wrote education in note pad on phone.        Assessment/Plan    PT Assessment Patient needs continued PT services  PT Problem List Decreased mobility;Other (comment) (dizziness)       PT Treatment Interventions DME instruction;Therapeutic exercise;Gait training;Balance training;Manual techniques;Neuromuscular re-education;Functional mobility training;Therapeutic activities;Patient/family education    PT Goals (Current goals can be found in the Care Plan section)  Acute Rehab PT Goals Patient Stated Goal: Improve symptoms PT Goal Formulation: With patient Time For Goal Achievement: 05/20/23 Potential to Achieve Goals: Good Additional Goals Additional Goal #1: Pt will perform head movements in all directions and positional changes without dizziness, lightheadedness or nausea.    Frequency Min 1X/week        AM-PAC PT "6 Clicks" Mobility  Outcome Measure Help needed turning from your back to your side while in a flat bed without using bedrails?: None Help needed moving from lying on your back to sitting on the side of a flat bed without using bedrails?: None Help needed moving to and from a bed to a chair (including a wheelchair)?: None Help needed standing up from a chair using your arms (e.g., wheelchair or bedside chair)?: None Help needed to walk in hospital room?: None Help needed climbing 3-5 steps with a railing? : A Little 6 Click Score: 23    End of Session   Activity Tolerance: Patient tolerated treatment well Patient left: in chair;Other (comment);with call bell/phone within reach (EEG staff in room) Nurse Communication: Mobility status PT Visit Diagnosis: Dizziness and  giddiness (R42);Other abnormalities of gait and mobility (R26.89)    Time: 0454-0981 PT Time Calculation (min) (ACUTE ONLY): 23 min   Charges:   PT Evaluation $PT Eval Low Complexity: 1 Low PT Treatments $Therapeutic Activity: 8-22 mins PT General Charges $$ ACUTE PT VISIT: 1 Visit         Harrel Carina, DPT, CLT  Acute Rehabilitation Services Office: [phone] (Secure chat preferred)   Claudia Desanctis 05/06/2023, 11:21 AM

## 2023-05-06 NOTE — Evaluation (Signed)
Clinical/Bedside Swallow Evaluation Patient Details  Name: Christopher [name] MRN: [id number] Date of Birth: March 23, 1956  Today's Date: 05/06/2023 Time: SLP Start Time (ACUTE ONLY): 1524 SLP Stop Time (ACUTE ONLY): 1537 SLP Time Calculation (min) (ACUTE ONLY): 13 min  Past Medical History:  Past Medical History:  Diagnosis Date   Anxiety    Cervical disc disease    Coronary artery disease    GERD (gastroesophageal reflux disease)    triggers by eating spicy food per pt   Raynaud's disease    Vitamin B 12 deficiency    Vitamin D deficiency    Past Surgical History:  Past Surgical History:  Procedure Laterality Date   CARDIAC CATHETERIZATION     COLONOSCOPY  2011   Johnson-normal exam per pt   CORONARY IMAGING/OCT N/A 11/18/2020   Procedure: INTRAVASCULAR IMAGING/OCT;  Surgeon: Iran Ouch, MD;  Location: [facility];  Service: Cardiovascular;  Laterality: N/A;   CORONARY IMAGING/OCT N/A 12/16/2020   Procedure: INTRAVASCULAR IMAGING/OCT;  Surgeon: Iran Ouch, MD;  Location: [facility];  Service: Cardiovascular;  Laterality: N/A;   CORONARY STENT INTERVENTION  11/18/2020   CORONARY STENT INTERVENTION N/A 11/18/2020   Procedure: CORONARY STENT INTERVENTION;  Surgeon: Iran Ouch, MD;  Location: [facility];  Service: Cardiovascular;  Laterality: N/A;   CORONARY STENT INTERVENTION N/A 12/16/2020   Procedure: CORONARY STENT INTERVENTION;  Surgeon: Iran Ouch, MD;  Location: [facility];  Service: Cardiovascular;  Laterality: N/A;   infertility surgery     LEFT HEART CATH AND CORONARY ANGIOGRAPHY N/A 11/18/2020   Procedure: LEFT HEART CATH AND CORONARY ANGIOGRAPHY;  Surgeon: Iran Ouch, MD;  Location: [facility];  Service: Cardiovascular;  Laterality: N/A;   NECK SURGERY  2007   UPPER GASTROINTESTINAL ENDOSCOPY  2020   Ganem-h.pylori per pt   HPI:  Christopher [name] is a 67 y.o. male who presented as a code stroke due to  transient confusion, slurred speech after being in the heat. CT and MRI were negative for acute intracranial abnormality. PMHx:  PAF diagnosis on eliquis,  anxiety, CAD, GERD, HLD, Raynaud's disease, cervical disc disease, neck surgery    Assessment / Plan / Recommendation  Clinical Impression  Pt's oropharyngeal swallow appears to be functional and he has no subjective complaints or signs of aspiration. He does endorse occasional symptoms of his reflux, but says he has not had any this admissiob (and he completely avoids spicy foods at home). No SLP f/u indicated for swallowing. SLP Visit Diagnosis: Dysphagia, unspecified (R13.10)    Aspiration Risk  No limitations    Diet Recommendation Regular;Thin liquid    Liquid Administration via: Cup;Straw Medication Administration: Whole meds with liquid Supervision: Patient able to self feed Postural Changes: Seated upright at 90 degrees    Other  Recommendations Oral Care Recommendations: Oral care BID    Recommendations for follow up therapy are one component of a multi-disciplinary discharge planning process, led by the attending physician.  Recommendations may be updated based on patient status, additional functional criteria and insurance authorization.  Follow up Recommendations No SLP follow up      Assistance Recommended at Discharge    Functional Status Assessment Patient has not had a recent decline in their functional status  Frequency and Duration            Prognosis        Swallow Study   General HPI: Christopher [name] is  a 67 y.o. male who presented as a code stroke due to transient confusion, slurred speech after being in the heat. CT and MRI were negative for acute intracranial abnormality. PMHx:  PAF diagnosis on eliquis,  anxiety, CAD, GERD, HLD, Raynaud's disease, cervical disc disease, neck surgery Type of Study: Bedside Swallow Evaluation Previous Swallow Assessment: none in chart Diet Prior to this Study:  Regular;Thin liquids (Level 0) Temperature Spikes Noted: No Respiratory Status: Room air History of Recent Intubation: No Behavior/Cognition: Alert;Cooperative Oral Cavity Assessment: Within Functional Limits Oral Care Completed by SLP: No Oral Cavity - Dentition: Adequate natural dentition Vision: Functional for self-feeding Self-Feeding Abilities: Able to feed self Patient Positioning: Other (comment) (EOB) Baseline Vocal Quality: Normal Volitional Cough: Strong Volitional Swallow: Able to elicit    Oral/Motor/Sensory Function Overall Oral Motor/Sensory Function: Within functional limits   Ice Chips Ice chips: Not tested   Thin Liquid Thin Liquid: Within functional limits Presentation: Self Fed;Straw    Nectar Thick Nectar Thick Liquid: Not tested   Honey Thick Honey Thick Liquid: Not tested   Puree Puree: Within functional limits Presentation: Self Fed;Spoon   Solid     Solid: Within functional limits Presentation: Self Fed       Mahala Menghini., M.A. CCC-SLP Acute Rehabilitation Services Office [phone]  Secure chat preferred  05/06/2023,3:58 PM

## 2023-05-06 NOTE — Procedures (Signed)
Patient Name: Christopher [name]  MRN: [id number]  Epilepsy Attending: Charlsie Quest  Referring Physician/Provider: Leroy Sea, MD  Date: 05/06/2023 Duration: 23 mins  Patient history: 67yo M with multiple episodes of transient dysarthria, dizziness getting eeg to evaluate for seizure  Level of alertness: Awake, asleep  AEDs during EEG study: None  Technical aspects: This EEG study was done with scalp electrodes positioned according to the 10-20 International system of electrode placement. Electrical activity was reviewed with band pass filter of 1-70Hz , sensitivity of 7 uV/mm, display speed of 12mm/sec with a 60Hz  notched filter applied as appropriate. EEG data were recorded continuously and digitally stored.  Video monitoring was available and reviewed as appropriate.  Description: The posterior dominant rhythm consists of 8 Hz activity of moderate voltage (25-35 uV) seen predominantly in posterior head regions, symmetric and reactive to eye opening and eye closing. Sleep was characterized by vertex waves, sleep spindles (12 to 14 Hz), maximal frontocentral region. Hyperventilation and photic stimulation were not performed.     IMPRESSION: This study is within normal limits. No seizures or epileptiform discharges were seen throughout the recording.  A normal interictal EEG does not exclude the diagnosis of epilepsy.  Malijah Lietz Annabelle Harman

## 2023-05-06 NOTE — Progress Notes (Signed)
PROGRESS NOTE                                                                                                                                                                                                             Patient Demographics:    Christopher [name], is a 67 y.o. male, DOB - Feb 04, 1956, HYQ:[id number]  Outpatient Primary MD for the patient is Jackelyn Poling, DO    LOS - 0  Admit date - 05/05/2023    Chief Complaint  Patient presents with   Code Stroke       Brief Narrative (HPI from H&P)    67 y.o. male with medical history significant of recent PAF diagnosis on eliquis,  anxiety, CAD, GERD, HLD, Raynaud's disease who presents to ED as a code stroke due to transient confusion, slurred speech. He was at the [employer] and he got in the car and while they driving home they were talking and all of a sudden he couldn't talk. He had mumbled speech/dysarthric speech and slurred speech. He felt like his mouth went numb, came to the ER where he was seen by neurologist, underwent MRI brain, CTA head and neck which were unremarkable, stroke was ruled out and he was admitted for further workup for his symptoms.  Per neurology these were not related to any stroke or TIA.   Subjective:    [facility] today has, No headache, No chest pain, No abdominal pain - No Nausea, No new weakness tingling or numbness, no SOB   Assessment  & Plan :    Transient dysarthria, dizziness, multiple such episodes in the last several months especially when exposed to heat. Seen by neurology and cleared by them from an acute neurological event standpoint, he was hypotensive in the ER and is clearly orthostatic this morning, this could be the reason for his symptoms specially when he is upright, sitting up or exposed to heat.  Will be hydrated, TED stockings added, PT OT and advance activity.  He also has at times some abdominal discomfort and pain  with the above symptoms, question if he could have aura of seizure, daughter has history of seizures, will check EEG as well although clinical chances of this being positive is low.  Hyponatremia Acute on chronic baseline sodium is 132, clinically dehydrated and hypotensive hydrate and monitor.  Heat  intolerance Stable TSH outpatient PCP workup.  Paroxysmal atrial fibrillation (HCC) with [place] vas 2 score of 3. Seen in ED in July with possible atrial fib vs. SVT, EDP discussed with cardiology and decided to start eliquis, he is currently on Eliquis and will be discharged with it along with a 2-week monitor upon discharge and cardiology follow-up.  Recent echo done few days ago was unremarkable.  Anxiety On xanax, continue supportive care with outpatient follow-up with PCP and psychiatry if needed.  Insomnia Has tried amitriptyline and melatonin.   CAD (coronary artery disease) 10/2020: The left circumflex had 80% stenosis. He underwent successful PCI with DES to the mid LAD. The procedure was very difficult due to calcifications. There was 50% residual stenosis in the proximal segment of the stent in spite of high-pressure balloon. 2 small diagonals were jailed by the stent with sluggish flow.  11/2020:He underwent staged DES placement to the mid left circumflex  Continue medical management with ranexa, crestor and now Eliquis, follow-up with Dr. Bascom Levels postdischarge.  Dyslipidemia Continue crestor         Condition - Fair  Family Communication  :  None  Code Status :  Full  Consults  :  None  PUD Prophylaxis :    Procedures  :     MRI - No acute intracranial abnormality. Largely normal for age noncontrast MRI appearance of the Brain.   CTA - Patent vasculature of the head and neck with no hemodynamically significant stenosis, occlusion, or dissection.  EEG -   TTE  1 week ago -  1. Left ventricular ejection fraction, by estimation, is 60 to 65%. Left ventricular ejection  fraction by 3D volume is 69 %. The left ventricle has  normal function. The left ventricle has no regional wall motion abnormalities. Left ventricular diastolic   parameters were normal.   2. Right ventricular systolic function is normal. The right ventricular size is normal.   3. The mitral valve is normal in structure. No evidence of mitral valve regurgitation. No evidence of mitral stenosis.   4. The aortic valve is tricuspid. Aortic valve regurgitation is not visualized. No aortic stenosis is present.   5. The inferior vena cava is normal in size with greater than 50% respiratory variability, suggesting right atrial pressure of 3 mmHg.       Disposition Plan  :    Status is: Observation  DVT Prophylaxis  :    Place TED hose Start: 05/06/23 0913 apixaban (ELIQUIS) tablet 5 mg     Lab Results  Component Value Date   PLT 136 (L) 05/05/2023    Diet :  Diet Order             Diet Heart Room service appropriate? Yes; Fluid consistency: Thin  Diet effective now                    Inpatient Medications  Scheduled Meds:  apixaban  5 mg Oral BID   ranolazine  1,000 mg Oral BID   rosuvastatin  20 mg Oral Q2000   traZODone  25 mg Oral QHS   Continuous Infusions:  lactated ringers     PRN Meds:.acetaminophen **OR** acetaminophen, ALPRAZolam  Antibiotics  :    Anti-infectives (From admission, onward)    None         Objective:   Vitals:   05/05/23 2032 05/06/23 0000 05/06/23 0433 05/06/23 0830  BP: 105/64 114/66 106/62 118/77  Pulse: 81  Resp: 18  17   Temp: 98.2 F (36.8 C) 98.1 F (36.7 C) 97.9 F (36.6 C)   TempSrc: Oral Oral Oral   SpO2: 94%     Weight:        Wt Readings from Last 3 Encounters:  05/05/23 60.2 kg  04/10/23 59.9 kg  04/02/23 59.1 kg    No intake or output data in the 24 hours ending 05/06/23 1009   Physical Exam  Awake Alert, No new F.N deficits, Normal affect [place].AT,PERRAL Supple Neck, No JVD,   Symmetrical Chest  wall movement, Good air movement bilaterally, CTAB RRR,No Gallops,Rubs or new Murmurs,  +ve B.Sounds, Abd Soft, No tenderness,   No Cyanosis, Clubbing or edema        Data Review:    Recent Labs  Lab 05/05/23 1026 05/05/23 1034  WBC 4.2  --   HGB 12.8* 11.6*  HCT 36.5* 34.0*  PLT 136*  --   MCV 90.1  --   MCH 31.6  --   MCHC 35.1  --   RDW 12.7  --   LYMPHSABS 1.2  --   MONOABS 0.3  --   EOSABS 0.0  --   BASOSABS 0.0  --     Recent Labs  Lab 05/05/23 1026 05/05/23 1034 05/05/23 1619 05/06/23 0112  NA 124* 126*  --  129*  K 4.7 4.4  --  3.8  CL 91* 94*  --  92*  CO2 23  --   --  25  ANIONGAP 10  --   --  12  GLUCOSE 115* 109*  --  107*  BUN 12 13  --  15  CREATININE 1.00 0.90  --  1.00  AST 24  --   --   --   ALT 18  --   --   --   ALKPHOS 23*  --   --   --   BILITOT 1.7*  --   --   --   ALBUMIN 3.8  --   --   --   INR  --   --  1.1  --   TSH  --   --  1.654  --   CALCIUM 8.4*  --   --  8.4*      Recent Labs  Lab 05/05/23 1026 05/05/23 1619 05/06/23 0112  INR  --  1.1  --   TSH  --  1.654  --   CALCIUM 8.4*  --  8.4*    Recent Labs  Lab 05/05/23 1026 05/05/23 1034 05/06/23 0112  WBC 4.2  --   --   PLT 136*  --   --   CREATININE 1.00 0.90 1.00    ------------------------------------------------------------------------------------------------------------------ Lab Results  Component Value Date   CHOL 120 05/06/2023   HDL 64 05/06/2023   LDLCALC 46 05/06/2023   TRIG 48 05/06/2023   CHOLHDL 1.9 05/06/2023    No results found for: "HGBA1C"  Recent Labs    05/05/23 1619  TSH 1.654     Radiology Reports MR BRAIN WO CONTRAST  Result Date: 05/05/2023 CLINICAL DATA:  67 year old male code stroke presentation today. Confusion. Atrial flutter on Eliquis. EXAM: MRI HEAD WITHOUT CONTRAST TECHNIQUE: Multiplanar, multiecho pulse sequences of the brain and surrounding structures were obtained without intravenous contrast. COMPARISON:  Head CT  1033 hours. FINDINGS: Brain: No restricted diffusion to suggest acute infarction. No midline shift, mass effect, evidence of mass lesion, ventriculomegaly, extra-axial collection or acute intracranial hemorrhage. Cervicomedullary  junction and pituitary are within normal limits. Largely normal for age gray and white matter signal throughout the brain. Minimal nonspecific white matter T2 and FLAIR hyperintensity, such as on series 6, image 20. No cortical encephalomalacia. No chronic cerebral blood products. Deep gray nuclei, brainstem and cerebellum appear negative. Vascular: Major intracranial vascular flow voids are preserved. Skull and upper cervical spine: Negative for age visible cervical spine. Visualized bone marrow signal is within normal limits. Sinuses/Orbits: Negative. Other: Visible internal auditory structures appear normal. Negative visible scalp and face. IMPRESSION: No acute intracranial abnormality. Largely normal for age noncontrast MRI appearance of the Brain. Electronically Signed   By: Odessa Fleming M.D.   On: 05/05/2023 12:23   CT ANGIO HEAD NECK W WO CM  Result Date: 05/05/2023 CLINICAL DATA:  Stroke/TIA EXAM: CT ANGIOGRAPHY HEAD AND NECK WITH AND WITHOUT CONTRAST TECHNIQUE: Multidetector CT imaging of the head and neck was performed using the standard protocol during bolus administration of intravenous contrast. Multiplanar CT image reconstructions and MIPs were obtained to evaluate the vascular anatomy. Carotid stenosis measurements (when applicable) are obtained utilizing NASCET criteria, using the distal internal carotid diameter as the denominator. RADIATION DOSE REDUCTION: This exam was performed according to the departmental dose-optimization program which includes automated exposure control, adjustment of the mA and/or kV according to patient size and/or use of iterative reconstruction technique. CONTRAST:  75mL OMNIPAQUE IOHEXOL 350 MG/ML SOLN COMPARISON:  Same-day noncontrast CT head  FINDINGS: CTA NECK FINDINGS Aortic arch: The imaged aortic arch is normal. The origins of the major branch vessels are patent. The subclavian arteries are patent to the level imaged. Right carotid system: The right common, internal, and external carotid arteries are patent, without hemodynamically significant stenosis or occlusion there is no evidence of dissection or aneurysm. Left carotid system: The left common, internal, and external carotid arteries are patent, without hemodynamically significant stenosis or occlusion. There is no evidence of dissection or aneurysm. Vertebral arteries: The vertebral arteries are patent, without hemodynamically significant stenosis or occlusion there is no evidence of dissection or aneurysm. Skeleton: There is no acute osseous abnormality or suspicious osseous lesion. There is no visible canal hematoma. The patient is status post C4 through C7 ACDF with solid osseous fusion. Other neck: The soft tissues of the neck are unremarkable. Upper chest: The imaged lung apices are clear. Review of the MIP images confirms the above findings CTA HEAD FINDINGS Anterior circulation: There is mild calcified plaque in the intracranial ICAs resulting in mild stenosis of the paraclinoid segments bilaterally. The bilateral MCAs and ACAS are patent, without proximal stenosis or occlusion. The anterior communicating artery is normal. There is no aneurysm or AVM. Posterior circulation: The bilateral V4 segments are patent. The basilar artery is patent. The major cerebellar arteries appear patent. The bilateral PCAs are patent, without proximal stenosis or occlusion. Right larger than left posterior communicating arteries are identified with fetal origin on the right. There is no aneurysm or AVM. Venous sinuses: Patent. Anatomic variants: As above. Review of the MIP images confirms the above findings IMPRESSION: Patent vasculature of the head and neck with no hemodynamically significant stenosis,  occlusion, or dissection. Findings communicated to Dr Iver Nestle at 11:12 am. Electronically Signed   By: Lesia Hausen M.D.   On: 05/05/2023 11:13   CT HEAD CODE STROKE WO CONTRAST  Result Date: 05/05/2023 CLINICAL DATA:  Code stroke.  67 year old male. EXAM: CT HEAD WITHOUT CONTRAST TECHNIQUE: Contiguous axial images were obtained from the base of the skull  through the vertex without intravenous contrast. RADIATION DOSE REDUCTION: This exam was performed according to the departmental dose-optimization program which includes automated exposure control, adjustment of the mA and/or kV according to patient size and/or use of iterative reconstruction technique. COMPARISON:  None Available. FINDINGS: Brain: Cerebral volume is within normal limits for age. No midline shift, ventriculomegaly, mass effect, evidence of mass lesion, intracranial hemorrhage or evidence of cortically based acute infarction. Gray-white matter differentiation is within normal limits throughout the brain. No encephalomalacia identified. Incidental dural calcification along the falx. Vascular: No suspicious intracranial vascular hyperdensity. Skull: No acute osseous abnormality identified. Sinuses/Orbits: Mucosal thickening or mucous retention cysts at the right maxillary alveolar recess. Otherwise clear. Other: Visualized orbits and scalp soft tissues are within normal limits. ASPECTS [facility] Stroke Program Early CT Score) Total score (0-10 with 10 being normal): 10 IMPRESSION: 1. Normal for age noncontrast CT appearance of the brain. ASPECTS 10. 2. These results were communicated to Dr. Iver Nestle at 10:37 am on 05/05/2023 by text page via the [facility] messaging system. Electronically Signed   By: Odessa Fleming M.D.   On: 05/05/2023 10:38      Signature  -   Susa Raring M.D on 05/06/2023 at 10:09 AM   -  To page go to [email or web address]

## 2023-05-06 NOTE — Progress Notes (Signed)
EEG complete - results pending 

## 2023-05-06 NOTE — TOC Initial Note (Signed)
Transition of Care [facility]) - Initial/Assessment Note    Patient Details  Name: Christopher [name] MRN: [id number] Date of Birth: October 05, 1955  Transition of Care [facility]) CM/SW Contact:    Ronny Bacon, RN Phone Number: 05/06/2023, 11:55 AM  Clinical Narrative:   Patient from home with spouse and one adult child. Patient confirms that he has PCP and pharmacy with access to care as needed. Patient has cane at home that he does not use often. Discussed outpatient physical therapy with vestibular concentration. Patient given options of outpatient neuro rehab facilities. Patient wants to use [facility] as it is closest to his home. Referral # [id number], placed for outpatient vistibular physical therapy. No OT therapy notes noted at this time, if recommendations made, will need to add to referral through [facility].                Expected Discharge Plan: OP Rehab Barriers to Discharge: Continued Medical Work up   Patient Goals and CMS Choice Patient states their goals for this hospitalization and ongoing recovery are:: to be able to ambulate withou dizziness and nausea          Expected Discharge Plan and Services       Living arrangements for the past 2 months: Single Family Home                                      Prior Living Arrangements/Services Living arrangements for the past 2 months: Single Family Home Lives with:: Adult Children, Spouse Patient language and need for interpreter reviewed:: Yes Do you feel safe going back to the place where you live?: Yes      Need for Family Participation in Patient Care: No (Comment) Care giver support system in place?: Yes (comment) Current home services: D[facility]) Criminal Activity/Legal Involvement Pertinent to Current Situation/Hospitalization: No - Comment as needed  Activities of Daily Living      Permission Sought/Granted                  Emotional Assessment Appearance:: Appears stated  age Attitude/Demeanor/Rapport: Engaged Affect (typically observed): Appropriate Orientation: : Oriented to Self, Oriented to Place, Oriented to  Time, Oriented to Situation Alcohol / Substance Use: Not Applicable Psych Involvement: No (comment)  Admission diagnosis:  Confusion [R41.0] Hyponatremia [E87.1] Patient Active Problem List   Diagnosis Date Noted   Paroxysmal atrial fibrillation (HCC) 05/05/2023   Anxiety 05/05/2023   Hyponatremia 05/05/2023   Insomnia 05/05/2023   transient dysarthria 05/05/2023   Heat intolerance 05/05/2023   Raynaud disease 12/16/2020   CAD (coronary artery disease) 11/19/2020   Dyslipidemia 11/19/2020   Unstable angina (HCC) 11/18/2020   LOW BACK PAIN 06/24/2007   PCP:  Jackelyn Poling, DO Pharmacy:   [facility] [phone] [place], [place] - [address] AT [facility] [address] & [facility] [address] [place] [address] Phone: [phone] Fax: [phone]  [facility] - [place], [place] - [phone] [place] [address] Phone: [phone] Fax: [phone]     Social Determinants of Health (SDOH) Social History: SDOH Screenings   Food Insecurity: No Food Insecurity (09/12/2022)   Received from [facility]  Depression [phone]): Low Risk  (05/05/2021)  Financial Resource Strain: Low Risk  (09/12/2022)   Received from [facility], [facility]  Physical Activity: Insufficiently Active (09/12/2022)   Received from [facility], [facility]  Social Connections: Moderately Integrated (09/12/2022)   Received from [facility], [facility]  Stress: No Stress Concern Present (09/12/2022)   Received from [facility], [facility]  Tobacco Use: Low Risk  (04/14/2023)   SDOH Interventions:     Readmission Risk Interventions     No data to display

## 2023-05-07 ENCOUNTER — Telehealth: Payer: Self-pay | Admitting: Cardiovascular Disease

## 2023-05-07 DIAGNOSIS — R471 Dysarthria and anarthria: Secondary | ICD-10-CM | POA: Diagnosis not present

## 2023-05-07 LAB — COMPREHENSIVE METABOLIC PANEL
ALT: 15 U/L (ref 0–44)
AST: 14 U/L — ABNORMAL LOW (ref 15–41)
Albumin: 3.5 g/dL (ref 3.5–5.0)
Alkaline Phosphatase: 26 U/L — ABNORMAL LOW (ref 38–126)
Anion gap: 9 (ref 5–15)
BUN: 9 mg/dL (ref 8–23)
CO2: 30 mmol/L (ref 22–32)
Calcium: 8.5 mg/dL — ABNORMAL LOW (ref 8.9–10.3)
Chloride: 94 mmol/L — ABNORMAL LOW (ref 98–111)
Creatinine, Ser: 1.03 mg/dL (ref 0.61–1.24)
GFR, Estimated: >60 mL/min (ref >60)
Glucose, Bld: 111 mg/dL — ABNORMAL HIGH (ref 70–99)
Potassium: 4.7 mmol/L (ref 3.5–5.1)
Sodium: 133 mmol/L — ABNORMAL LOW (ref 135–145)
Total Bilirubin: 1.1 mg/dL (ref 0.3–1.2)
Total Protein: 5.5 g/dL — ABNORMAL LOW (ref 6.5–8.1)

## 2023-05-07 MED ORDER — APIXABAN 5 MG PO TABS: 5.0000 mg | ORAL_TABLET | Freq: Two times a day (BID) | ORAL | 1 refills | Status: DC

## 2023-05-07 MED ORDER — LACTATED RINGERS IV SOLN: INTRAVENOUS | Status: AC

## 2023-05-07 MED ORDER — MELATONIN 3 MG PO TABS: 3.0000 mg | ORAL_TABLET | Freq: Every evening | ORAL | 0 refills | Status: AC | PRN

## 2023-05-07 NOTE — Discharge Summary (Signed)
Christopher [name] ZOX:[id number] DOB: 11/26/1955 DOA: 05/05/2023  PCP: Jackelyn Poling, DO  Admit date: 05/05/2023  Discharge date: 05/07/2023  Admitted From: Home   Disposition:  Home   Recommendations for Outpatient Follow-up:   Follow up with PCP in 1-2 weeks  PCP Please obtain BMP/CBC, 2 view CXR in 1week,  (see Discharge instructions)   PCP Please follow up on the following pending results: Monitor blood pressure, orthostatics and BMP closely.  Benefit from outpatient psychiatry follow-up for extreme anxiety.   Home Health: None   Equipment/Devices: None  Consultations: None  Discharge Condition: Stable    CODE STATUS: Full    Diet Recommendation: Heart Healthy     Chief Complaint  Patient presents with   Code Stroke     Brief history of present illness from the day of admission and additional interim summary    67 y.o. male with medical history significant of recent PAF diagnosis on eliquis,  anxiety, CAD, GERD, HLD, Raynaud's disease who presents to ED as a code stroke due to transient confusion, slurred speech. He was at the [employer] and he got in the car and while they driving home they were talking and all of a sudden he couldn't talk. He had mumbled speech/dysarthric speech and slurred speech. He felt like his mouth went numb, came to the ER where he was seen by neurologist, underwent MRI brain, CTA head and neck which were unremarkable, stroke was ruled out and he was admitted for further workup for his symptoms.  Per neurology these were not related to any stroke or TIA.                                                                  Hospital Course    Transient dysarthria, dizziness, multiple such episodes in the last several months especially when exposed to heat.  Most likely symptoms coming  from combination of extreme dehydration and some anxiety. Seen by neurology and cleared by them from an acute neurological event standpoint, he was hypotensive in the ER and is clearly orthostatic this morning, this could be the reason for his symptoms specially when he is upright, sitting up or exposed to heat.  He had thorough neurological workup including MRI brain, CTA head and neck, EEG, recent echocardiogram, recent 2-week event monitor, all were unremarkable, after hydration orthostatics have improved, he is now symptom-free, stable blood pressure.  Has been counseled to keep himself well-hydrated at home.  Will be discharged with outpatient follow-up with PCP.  He will benefit with outpatient psych consult as well for moderate anxiety.  Per patient he is having a lot of stressors at home, not suicidal or homicidal.  Hyponatremia Acute on chronic baseline sodium is 132, clinically dehydrated and hypotensive hydrate and monitor.   Heat  intolerance Stable TSH outpatient PCP workup.   Paroxysmal atrial fibrillation (HCC) with [place] vas 2 score of 3. Seen in ED in July with possible atrial fib vs. SVT, he was started on Eliquis outpatient after discussions with cardiology, he is currently on Eliquis, he also had 2 weeks of event monitor recently which was unremarkable results below.  Recent echo done few days ago was unremarkable.  Will request PCP to continue to arrange him with outpatient cardiology follow-up.   Anxiety On xanax, continue supportive care with outpatient follow-up with PCP and psychiatry if needed.   Insomnia PRN melatonin.    CAD (coronary artery disease) 10/2020: The left circumflex had 80% stenosis. He underwent successful PCI with DES to the mid LAD. The procedure was very difficult due to calcifications. There was 50% residual stenosis in the proximal segment of the stent in spite of high-pressure balloon. 2 small diagonals were jailed by the stent with sluggish flow.   11/2020:He underwent staged DES placement to the mid left circumflex, no acute issues follow-up with cardiology outpatient postdischarge.  Echo done few weeks ago nonacute.   Dyslipidemia Continue crestor     Discharge diagnosis     Principal Problem:   transient dysarthria Active Problems:   Hyponatremia   Heat intolerance   Paroxysmal atrial fibrillation (HCC)   Anxiety   Insomnia   CAD (coronary artery disease)   Dyslipidemia    Discharge instructions    Discharge Instructions     Discharge instructions   Complete by: As directed    Follow with Primary MD Jackelyn Poling, DO in 7 days, also follow-up with your cardiologist in 7 to 10 days.  Get CBC, CMP, 2 view Chest X ray -  checked next visit with your primary MD    Activity: As tolerated with Full fall precautions use walker/cane & assistance as needed  Disposition Home    Diet: Heart Healthy, keep yourself well-hydrated  Special Instructions: If you have smoked or chewed Tobacco  in the last 2 yrs please stop smoking, stop any regular Alcohol  and or any Recreational drug use.  On your next visit with your primary care physician please Get Medicines reviewed and adjusted.  Please request your Prim.MD to go over all Hospital Tests and Procedure/Radiological results at the follow up, please get all Hospital records sent to your Prim MD by signing hospital release before you go home.  If you experience worsening of your admission symptoms, develop shortness of breath, life threatening emergency, suicidal or homicidal thoughts you must seek medical attention immediately by calling 911 or calling your MD immediately  if symptoms less severe.  You Must read complete instructions/literature along with all the possible adverse reactions/side effects for all the Medicines you take and that have been prescribed to you. Take any new Medicines after you have completely understood and accpet all the possible adverse  reactions/side effects.   Increase activity slowly   Complete by: As directed        Discharge Medications   Allergies as of 05/07/2023       Reactions   Azithromycin Other (See Comments)   Stomach cramps         Medication List     TAKE these medications    ALPRAZolam 0.25 MG tablet Commonly known as: XANAX Take 1 tablet (0.25 mg total) by mouth at bedtime as needed for anxiety.   apixaban 5 MG Tabs tablet Commonly known as: ELIQUIS Take 1 tablet (5 mg total)  by mouth 2 (two) times daily.   melatonin 3 MG Tabs tablet Take 1 tablet (3 mg total) by mouth at bedtime as needed.   nitroGLYCERIN 0.4 MG SL tablet Commonly known as: NITROSTAT DISSOLVE ONE TABLET UNDER TONGUE EVERY 5 MINUTES AS NEEDED FOR CHEST PAIN   ranolazine 1000 MG SR tablet Commonly known as: RANEXA Take 1 tablet (1,000 mg total) by mouth 2 (two) times daily.   rosuvastatin 20 MG tablet Commonly known as: Crestor Take 1 tablet (20 mg total) by mouth daily.         Follow-up Information     [facility] Follow up.   Specialty: Rehabilitation Why: Outpatient Physical therapy- Vestibular focused. Office will call to arrange follow up after discharge. Contact information: [address] [place] [address] [phone]        Jackelyn Poling, DO. Schedule an appointment as soon as possible for a visit in 1 week(s).   Specialty: Family Medicine Contact information: [address] [place] [address] [phone]                 Major procedures and Radiology Reports - PLEASE review detailed and final reports thoroughly  -       EEG adult  Result Date: 05/06/2023 Charlsie Quest, MD     05/06/2023  1:14 PM Patient Name: Christopher [name] MRN: [id number] Epilepsy Attending: Charlsie Quest Referring Physician/Provider: Leroy Sea, MD Date: 05/06/2023 Duration: 23 mins Patient history: 67yo M with multiple  episodes of transient dysarthria, dizziness getting eeg to evaluate for seizure Level of alertness: Awake, asleep AEDs during EEG study: None Technical aspects: This EEG study was done with scalp electrodes positioned according to the 10-20 International system of electrode placement. Electrical activity was reviewed with band pass filter of 1-70Hz , sensitivity of 7 uV/mm, display speed of 77mm/sec with a 60Hz  notched filter applied as appropriate. EEG data were recorded continuously and digitally stored.  Video monitoring was available and reviewed as appropriate. Description: The posterior dominant rhythm consists of 8 Hz activity of moderate voltage (25-35 uV) seen predominantly in posterior head regions, symmetric and reactive to eye opening and eye closing. Sleep was characterized by vertex waves, sleep spindles (12 to 14 Hz), maximal frontocentral region. Hyperventilation and photic stimulation were not performed.   IMPRESSION: This study is within normal limits. No seizures or epileptiform discharges were seen throughout the recording. A normal interictal EEG does not exclude the diagnosis of epilepsy. Charlsie Quest   MR BRAIN WO CONTRAST  Result Date: 05/05/2023 CLINICAL DATA:  67 year old male code stroke presentation today. Confusion. Atrial flutter on Eliquis. EXAM: MRI HEAD WITHOUT CONTRAST TECHNIQUE: Multiplanar, multiecho pulse sequences of the brain and surrounding structures were obtained without intravenous contrast. COMPARISON:  Head CT 1033 hours. FINDINGS: Brain: No restricted diffusion to suggest acute infarction. No midline shift, mass effect, evidence of mass lesion, ventriculomegaly, extra-axial collection or acute intracranial hemorrhage. Cervicomedullary junction and pituitary are within normal limits. Largely normal for age gray and white matter signal throughout the brain. Minimal nonspecific white matter T2 and FLAIR hyperintensity, such as on series 6, image 20. No cortical  encephalomalacia. No chronic cerebral blood products. Deep gray nuclei, brainstem and cerebellum appear negative. Vascular: Major intracranial vascular flow voids are preserved. Skull and upper cervical spine: Negative for age visible cervical spine. Visualized bone marrow signal is within normal limits. Sinuses/Orbits: Negative. Other: Visible internal auditory structures  appear normal. Negative visible scalp and face. IMPRESSION: No acute intracranial abnormality. Largely normal for age noncontrast MRI appearance of the Brain. Electronically Signed   By: Odessa Fleming M.D.   On: 05/05/2023 12:23   CT ANGIO HEAD NECK W WO CM  Result Date: 05/05/2023 CLINICAL DATA:  Stroke/TIA EXAM: CT ANGIOGRAPHY HEAD AND NECK WITH AND WITHOUT CONTRAST TECHNIQUE: Multidetector CT imaging of the head and neck was performed using the standard protocol during bolus administration of intravenous contrast. Multiplanar CT image reconstructions and MIPs were obtained to evaluate the vascular anatomy. Carotid stenosis measurements (when applicable) are obtained utilizing NASCET criteria, using the distal internal carotid diameter as the denominator. RADIATION DOSE REDUCTION: This exam was performed according to the departmental dose-optimization program which includes automated exposure control, adjustment of the mA and/or kV according to patient size and/or use of iterative reconstruction technique. CONTRAST:  75mL OMNIPAQUE IOHEXOL 350 MG/ML SOLN COMPARISON:  Same-day noncontrast CT head FINDINGS: CTA NECK FINDINGS Aortic arch: The imaged aortic arch is normal. The origins of the major branch vessels are patent. The subclavian arteries are patent to the level imaged. Right carotid system: The right common, internal, and external carotid arteries are patent, without hemodynamically significant stenosis or occlusion there is no evidence of dissection or aneurysm. Left carotid system: The left common, internal, and external carotid arteries  are patent, without hemodynamically significant stenosis or occlusion. There is no evidence of dissection or aneurysm. Vertebral arteries: The vertebral arteries are patent, without hemodynamically significant stenosis or occlusion there is no evidence of dissection or aneurysm. Skeleton: There is no acute osseous abnormality or suspicious osseous lesion. There is no visible canal hematoma. The patient is status post C4 through C7 ACDF with solid osseous fusion. Other neck: The soft tissues of the neck are unremarkable. Upper chest: The imaged lung apices are clear. Review of the MIP images confirms the above findings CTA HEAD FINDINGS Anterior circulation: There is mild calcified plaque in the intracranial ICAs resulting in mild stenosis of the paraclinoid segments bilaterally. The bilateral MCAs and ACAS are patent, without proximal stenosis or occlusion. The anterior communicating artery is normal. There is no aneurysm or AVM. Posterior circulation: The bilateral V4 segments are patent. The basilar artery is patent. The major cerebellar arteries appear patent. The bilateral PCAs are patent, without proximal stenosis or occlusion. Right larger than left posterior communicating arteries are identified with fetal origin on the right. There is no aneurysm or AVM. Venous sinuses: Patent. Anatomic variants: As above. Review of the MIP images confirms the above findings IMPRESSION: Patent vasculature of the head and neck with no hemodynamically significant stenosis, occlusion, or dissection. Findings communicated to Dr Iver Nestle at 11:12 am. Electronically Signed   By: Lesia Hausen M.D.   On: 05/05/2023 11:13   CT HEAD CODE STROKE WO CONTRAST  Result Date: 05/05/2023 CLINICAL DATA:  Code stroke.  67 year old male. EXAM: CT HEAD WITHOUT CONTRAST TECHNIQUE: Contiguous axial images were obtained from the base of the skull through the vertex without intravenous contrast. RADIATION DOSE REDUCTION: This exam was performed  according to the departmental dose-optimization program which includes automated exposure control, adjustment of the mA and/or kV according to patient size and/or use of iterative reconstruction technique. COMPARISON:  None Available. FINDINGS: Brain: Cerebral volume is within normal limits for age. No midline shift, ventriculomegaly, mass effect, evidence of mass lesion, intracranial hemorrhage or evidence of cortically based acute infarction. Gray-white matter differentiation is within normal limits throughout the brain. No  encephalomalacia identified. Incidental dural calcification along the falx. Vascular: No suspicious intracranial vascular hyperdensity. Skull: No acute osseous abnormality identified. Sinuses/Orbits: Mucosal thickening or mucous retention cysts at the right maxillary alveolar recess. Otherwise clear. Other: Visualized orbits and scalp soft tissues are within normal limits. ASPECTS [facility] Stroke Program Early CT Score) Total score (0-10 with 10 being normal): 10 IMPRESSION: 1. Normal for age noncontrast CT appearance of the brain. ASPECTS 10. 2. These results were communicated to Dr. Iver Nestle at 10:37 am on 05/05/2023 by text page via the [facility] messaging system. Electronically Signed   By: Odessa Fleming M.D.   On: 05/05/2023 10:38   LONG TERM MONITOR (3-14 DAYS)  Result Date: 05/04/2023   The patient was monitored for 14 days.   The predominant rhythm was sinus with an average rate of 84 bpm (range 53-127 bpm in sinus).   There were rare PACs and PVCs.   Two supraventricular runs occurred, lasting up to 7 beats with a maximum rate of 154 bpm.   No sustained arrhythmia or prolonged pause was observed.   There were no patient triggered events. Predominantly sinus rhythm with rare PACs and PVCs as well as 2 brief supraventricular runs.   ECHOCARDIOGRAM COMPLETE  Result Date: 04/30/2023    ECHOCARDIOGRAM REPORT   Patient Name:   Christopher [name] Date of Exam: 04/30/2023 Medical Rec #:  [id number]       Height:       68.0 in Accession #:    [id number]     Weight:       132.0 lb Date of Birth:  24-Mar-1956      BSA:          1.713 m Patient Age:    67 years       BP:           108/70 mmHg Patient Gender: M              HR:           77 bpm. Exam Location:  [address] Procedure: 2D Echo, 3D Echo, Cardiac Doppler and Color Doppler Indications:    I48.92 Atrial Flutter  History:        Patient has no prior history of Echocardiogram examinations.                 CAD, Arrythmias:Atrial Flutter,                 Signs/Symptoms:Dizziness/Lightheadedness; Risk Factors:Family                 History of Coronary Artery Disease and Dyslipidemia.                 Palpitations.  Sonographer:    Farrel Conners RDCS Referring Phys: SHERI HAMMOCK IMPRESSIONS  1. Left ventricular ejection fraction, by estimation, is 60 to 65%. Left ventricular ejection fraction by 3D volume is 69 %. The left ventricle has normal function. The left ventricle has no regional wall motion abnormalities. Left ventricular diastolic  parameters were normal.  2. Right ventricular systolic function is normal. The right ventricular size is normal.  3. The mitral valve is normal in structure. No evidence of mitral valve regurgitation. No evidence of mitral stenosis.  4. The aortic valve is tricuspid. Aortic valve regurgitation is not visualized. No aortic stenosis is present.  5. The inferior vena cava is normal in size with greater than 50% respiratory variability, suggesting right atrial pressure of 3 mmHg. FINDINGS  Left Ventricle: Left ventricular ejection fraction, by estimation, is 60 to 65%. Left ventricular ejection fraction by 3D volume is 69 %. The left ventricle has normal function. The left ventricle has no regional wall motion abnormalities. The left ventricular internal cavity size was normal in size. There is no left ventricular hypertrophy. Left ventricular diastolic parameters were normal. Right Ventricle: The right ventricular size is  normal. No increase in right ventricular wall thickness. Right ventricular systolic function is normal. Left Atrium: Left atrial size was normal in size. Right Atrium: Right atrial size was normal in size. Pericardium: There is no evidence of pericardial effusion. Mitral Valve: The mitral valve is normal in structure. No evidence of mitral valve regurgitation. No evidence of mitral valve stenosis. Tricuspid Valve: The tricuspid valve is normal in structure. Tricuspid valve regurgitation is not demonstrated. No evidence of tricuspid stenosis. Aortic Valve: The aortic valve is tricuspid. Aortic valve regurgitation is not visualized. No aortic stenosis is present. Pulmonic Valve: The pulmonic valve was normal in structure. Pulmonic valve regurgitation is not visualized. No evidence of pulmonic stenosis. Aorta: The aortic root and ascending aorta are structurally normal, with no evidence of dilitation. Venous: The inferior vena cava is normal in size with greater than 50% respiratory variability, suggesting right atrial pressure of 3 mmHg. IAS/Shunts: No atrial level shunt detected by color flow Doppler.  LEFT VENTRICLE PLAX 2D LVIDd:         3.80 cm         Diastology LVIDs:         2.70 cm         LV e' medial:    8.38 cm/s LV PW:         0.80 cm         LV E/e' medial:  7.8 LV IVS:        0.70 cm         LV e' lateral:   12.20 cm/s LVOT diam:     2.00 cm         LV E/e' lateral: 5.4 LV SV:         63 LV SV Index:   37 LVOT Area:     3.14 cm        3D Volume EF                                LV 3D EF:    Left                                             ventricul                                             ar                                             ejection  fraction                                             by 3D                                             volume is                                             69 %.                                 3D Volume EF:                                 3D EF:        69 %                                LV EDV:       91 ml                                LV ESV:       28 ml                                LV SV:        63 ml RIGHT VENTRICLE RV Basal diam:  3.50 cm RV S prime:     15.50 cm/s TAPSE (M-mode): 2.2 cm LEFT ATRIUM             Index        RIGHT ATRIUM           Index LA diam:        2.40 cm 1.40 cm/m   RA Pressure: 3.00 mmHg LA Vol (A2C):   35.1 ml 20.49 ml/m  RA Area:     14.60 cm LA Vol (A4C):   27.2 ml 15.88 ml/m  RA Volume:   37.40 ml  21.83 ml/m LA Biplane Vol: 31.2 ml 18.21 ml/m  AORTIC VALVE LVOT Vmax:   97.45 cm/s LVOT Vmean:  62.100 cm/s LVOT VTI:    0.200 m  AORTA Ao Root diam: 3.20 cm Ao Asc diam:  3.40 cm MITRAL VALVE               TRICUSPID VALVE MV Area (PHT): cm         Estimated RAP:  3.00 mmHg MV Decel Time: 259 msec MV E velocity: 65.55 cm/s  SHUNTS MV A velocity: 48.70 cm/s  Systemic VTI:  0.20 m MV E/A ratio:  1.35        Systemic Diam: 2.00 cm Rachelle Hora Croitoru MD Electronically signed by Thurmon Fair MD Signature Date/Time: 04/30/2023/5:58:15 PM    Final     Micro Results     No results found for this  or any previous visit (from the past 240 hour(s)).  Today   Subjective    Christopher [name] today has no headache,no chest abdominal pain,no new weakness tingling or numbness, feels much better wants to go home today.     Objective   Blood pressure 130/70, pulse 82, temperature 97.9 F (36.6 C), temperature source Oral, resp. rate 20, weight 60.2 kg, SpO2 97%.   Intake/Output Summary (Last 24 hours) at 05/07/2023 0933 Last data filed at 05/06/2023 1720 Gross per 24 hour  Intake 859.6 ml  Output --  Net 859.6 ml    Exam  Awake Alert, No new F.N deficits, little anxious   [place].AT,PERRAL Supple Neck,   Symmetrical Chest wall movement, Good air movement bilaterally, CTAB RRR,No Gallops,   +ve B.Sounds, Abd Soft, Non tender,  No Cyanosis, Clubbing or edema    Data Review   Recent Labs   Lab 05/05/23 1026 05/05/23 1034 05/07/23 0634  WBC 4.2  --  3.7*  HGB 12.8* 11.6* 12.5*  HCT 36.5* 34.0* 36.4*  PLT 136*  --  128*  MCV 90.1  --  88.1  MCH 31.6  --  30.3  MCHC 35.1  --  34.3  RDW 12.7  --  12.8  LYMPHSABS 1.2  --  1.2  MONOABS 0.3  --  0.4  EOSABS 0.0  --  0.0  BASOSABS 0.0  --  0.0    Recent Labs  Lab 05/05/23 1026 05/05/23 1034 05/05/23 1619 05/06/23 0112 05/07/23 0634  NA 124* 126*  --  129* 133*  K 4.7 4.4  --  3.8 4.7  CL 91* 94*  --  92* 94*  CO2 23  --   --  25 30  ANIONGAP 10  --   --  12 9  GLUCOSE 115* 109*  --  107* 111*  BUN 12 13  --  15 9  CREATININE 1.00 0.90  --  1.00 1.03  AST 24  --   --   --  14*  ALT 18  --   --   --  15  ALKPHOS 23*  --   --   --  26*  BILITOT 1.7*  --   --   --  1.1  ALBUMIN 3.8  --   --   --  3.5  INR  --   --  1.1  --   --   TSH  --   --  1.654  --   --   HGBA1C  --   --  5.4  --   --   BNP  --   --   --   --  179.1*  MG  --   --   --   --  2.0  CALCIUM 8.4*  --   --  8.4* 8.5*    Total Time in preparing paper work, data evaluation and todays exam - 35 minutes  Signature  -    Susa Raring M.D on 05/07/2023 at 9:33 AM   -  To page go to [email or web address]

## 2023-05-07 NOTE — Discharge Instructions (Signed)
Follow with Primary MD Jackelyn Poling, DO in 7 days, also follow-up with your cardiologist in 7 to 10 days.  Get CBC, CMP, 2 view Chest X ray -  checked next visit with your primary MD    Activity: As tolerated with Full fall precautions use walker/cane & assistance as needed  Disposition Home    Diet: Heart Healthy, keep yourself well-hydrated  Special Instructions: If you have smoked or chewed Tobacco  in the last 2 yrs please stop smoking, stop any regular Alcohol  and or any Recreational drug use.  On your next visit with your primary care physician please Get Medicines reviewed and adjusted.  Please request your Prim.MD to go over all Hospital Tests and Procedure/Radiological results at the follow up, please get all Hospital records sent to your Prim MD by signing hospital release before you go home.  If you experience worsening of your admission symptoms, develop shortness of breath, life threatening emergency, suicidal or homicidal thoughts you must seek medical attention immediately by calling 911 or calling your MD immediately  if symptoms less severe.  You Must read complete instructions/literature along with all the possible adverse reactions/side effects for all the Medicines you take and that have been prescribed to you. Take any new Medicines after you have completely understood and accpet all the possible adverse reactions/side effects.

## 2023-05-07 NOTE — Plan of Care (Signed)

## 2023-05-07 NOTE — Progress Notes (Signed)
Explained discharge instructions to patient. Reviewed follow up appointment and next medication administration times. Also reviewed education. Patient verbalized having an understanding for instructions given. All belongings are in the patient's possession. IV and telemetry were removed. CCMD was notified. No other needs verbalized. Transported downstairs for discharge. 

## 2023-05-07 NOTE — Plan of Care (Signed)
  Problem: Education: Goal: Knowledge of General Education information will improve Description: Including pain rating scale, medication(s)/side effects and non-pharmacologic comfort measures Outcome: Adequate for Discharge   Problem: Health Behavior/Discharge Planning: Goal: Ability to manage health-related needs will improve Outcome: Adequate for Discharge   Problem: Clinical Measurements: Goal: Ability to maintain clinical measurements within normal limits will improve Outcome: Adequate for Discharge Goal: Will remain free from infection Outcome: Adequate for Discharge Goal: Diagnostic test results will improve Outcome: Adequate for Discharge Goal: Respiratory complications will improve Outcome: Adequate for Discharge Goal: Cardiovascular complication will be avoided Outcome: Adequate for Discharge   Problem: Activity: Goal: Risk for activity intolerance will decrease Outcome: Adequate for Discharge   Problem: Nutrition: Goal: Adequate nutrition will be maintained Outcome: Adequate for Discharge   Problem: Coping: Goal: Level of anxiety will decrease Outcome: Adequate for Discharge   Problem: Elimination: Goal: Will not experience complications related to bowel motility Outcome: Adequate for Discharge Goal: Will not experience complications related to urinary retention Outcome: Adequate for Discharge   Problem: Pain Managment: Goal: General experience of comfort will improve Outcome: Adequate for Discharge   Problem: Safety: Goal: Ability to remain free from injury will improve Outcome: Adequate for Discharge   Problem: Skin Integrity: Goal: Risk for impaired skin integrity will decrease Outcome: Adequate for Discharge   Problem: Acute Rehab OT Goals (only OT should resolve) Goal: OT Additional ADL Goal #1 Outcome: Adequate for Discharge Goal: OT Additional ADL Goal #2 Outcome: Adequate for Discharge   Problem: Acute Rehab PT Goals(only PT should  resolve) Goal: Pt Will Ambulate Outcome: Adequate for Discharge Goal: Pt Will Go Up/Down Stairs Outcome: Adequate for Discharge Goal: PT Additional Goal #1 Outcome: Adequate for Discharge

## 2023-05-07 NOTE — Telephone Encounter (Signed)
Refill request

## 2023-05-07 NOTE — Progress Notes (Signed)
Physical Therapy Treatment Patient Details Name: Christopher [name] MRN: [id number] DOB: 15-Jan-1956 Today's Date: 05/07/2023   History of Present Illness Christopher [name] is a 67 y.o. male who presented as a code stroke due to transient confusion, slurred speech after being in the heat. CT and MRI were negative for acute intracranial abnormality. PMHx:  PAF diagnosis on eliquis,  anxiety, CAD, GERD, HLD, Raynaud's disease    PT Comments  Pt is presenting close to baseline with functional mobility. Orthostatics taken with no significant findings or symptoms with position changes. Pt was wearing compression socks.  Vestibular assessment was negative checking all 3 canals and head movements in ambulation; most likely symptoms yesterday were related to hypofunctioning of the inner ear related to possible dehydration and thickening of the endolymph. Due to pt current functional status, home set up and current symptoms recommending skilled physical therapy services 3x/weekly on discharge with a vestibular physical therapist in order to address symptoms.       If plan is discharge home, recommend the following: Other (comment) (pt will not need assistance at home)     Equipment Recommendations  None recommended by PT       Precautions / Restrictions Precautions Precautions: Fall Precaution Comments: orthostatic Restrictions Weight Bearing Restrictions: No    Dix-Hallpike (posterior canal)  Horizontal Canal Anterior Canal   Mobility  Bed Mobility Overal bed mobility: Independent       Patient Response: Anxious, Cooperative  Transfers Overall transfer level: Independent Equipment used: None        Ambulation/Gait Ambulation/Gait assistance: Independent Gait Distance (Feet): 500 Feet Assistive device: None Gait Pattern/deviations: WFL(Within Functional Limits), Decreased step length - right, Decreased step length - left, Narrow base of support   Gait velocity interpretation: >2.62  ft/sec, indicative of community ambulatory   General Gait Details: Pt continues to be cautious with gait.   Stairs Stairs:  (Pt is demonstrating good strength as demonstrated by sit to stand and gait independently.)             Tilt Bed Tilt Bed Patient Response: Anxious, Cooperative        Balance Overall balance assessment: Modified Independent          Cognition Arousal: Alert Behavior During Therapy: Anxious, WFL for tasks assessed/performed Overall Cognitive Status: Within Functional Limits for tasks assessed             General Comments General comments (skin integrity, edema, etc.): Assessed peripheral vestibular system today. Orthostatics supine: 129/71, Sitting: 133/81, Standing: 137/81 after gait: 131/82      Pertinent Vitals/Pain Pain Assessment Pain Assessment: No/denies pain     PT Goals (current goals can now be found in the care plan section) Acute Rehab PT Goals Patient Stated Goal: Improve symptoms PT Goal Formulation: With patient Time For Goal Achievement: 05/20/23 Potential to Achieve Goals: Good Additional Goals Additional Goal #1: Pt will perform head movements in all directions and positional changes without dizziness, lightheadedness or nausea. Progress towards PT goals: Progressing toward goals    Frequency    Min 1X/week      PT Plan  Continue with current POC       AM-PAC PT "6 Clicks" Mobility   Outcome Measure  Help needed turning from your back to your side while in a flat bed without using bedrails?: None Help needed moving from lying on your back to sitting on the side of a flat bed without using bedrails?: None Help needed moving to and  from a bed to a chair (including a wheelchair)?: None Help needed standing up from a chair using your arms (e.g., wheelchair or bedside chair)?: None Help needed to walk in hospital room?: None Help needed climbing 3-5 steps with a railing? : None 6 Click Score: 24    End  of Session Equipment Utilized During Treatment: Gait belt Activity Tolerance: Patient tolerated treatment well Patient left: in bed;with call bell/phone within reach Nurse Communication: Mobility status PT Visit Diagnosis: Dizziness and giddiness (R42);Other abnormalities of gait and mobility (R26.89)     Time: 1610-9604 PT Time Calculation (min) (ACUTE ONLY): 39 min  Charges:    $Therapeutic Activity: 38-52 mins PT General Charges $$ ACUTE PT VISIT: 1 Visit                    Harrel Carina, DPT, CLT  Acute Rehabilitation Services Office: [phone] (Secure chat preferred)    Claudia Desanctis 05/07/2023, 9:28 AM

## 2023-05-07 NOTE — Telephone Encounter (Signed)
Prescription refill request for Eliquis received. Indication: AF Last office visit: 04/10/23  Lionel December NP Scr: 1.03 on 05/07/23 Age: 67 Weight: 59.9  Based on above findings Eliquis 5mg  twice daily is the appropriate dose.  Refill approved.

## 2023-05-07 NOTE — Telephone Encounter (Signed)
*  STAT* If patient is at the pharmacy, call can be transferred to refill team.   1. Which medications need to be refilled? (please list name of each medication and dose if known)   apixaban (ELIQUIS) 5 MG TABS tablet (Expired)    2. Would you like to learn more about the convenience, safety, & potential cost savings by using the [facility]?   3. Are you open to using the [facility] (Type [facility]. ).  4. Which pharmacy/location (including street and city if local pharmacy) is medication to be sent to?  [id number] - [place], [place] - [address] AT [facility] OF [address]   5. Do they need a 30 day or 90 day supply?   90 day  Patient stated still has 1-3 days left of this medication.

## 2023-05-16 ENCOUNTER — Encounter: Payer: Self-pay | Admitting: *Deleted

## 2023-06-04 ENCOUNTER — Encounter: Payer: Self-pay | Admitting: Cardiovascular Disease

## 2023-06-04 ENCOUNTER — Ambulatory Visit: Payer: BC Managed Care – PPO | Attending: Cardiovascular Disease | Admitting: Cardiovascular Disease

## 2023-06-04 VITALS — BP 124/74 | HR 75 | Ht 68.0 in | Wt 132.4 lb

## 2023-06-04 DIAGNOSIS — I480 Paroxysmal atrial fibrillation: Secondary | ICD-10-CM | POA: Diagnosis not present

## 2023-06-04 DIAGNOSIS — E785 Hyperlipidemia, unspecified: Secondary | ICD-10-CM

## 2023-06-04 DIAGNOSIS — R002 Palpitations: Secondary | ICD-10-CM | POA: Diagnosis not present

## 2023-06-04 DIAGNOSIS — I25118 Atherosclerotic heart disease of native coronary artery with other forms of angina pectoris: Secondary | ICD-10-CM

## 2023-06-04 MED ORDER — METOPROLOL SUCCINATE ER 25 MG PO TB24: 25.0000 mg | ORAL_TABLET | Freq: Every day | ORAL | 1 refills | Status: DC

## 2023-06-04 NOTE — Patient Instructions (Signed)
Medication Instructions:  START Metoprolol Succinate (Toprol) 25 mg once daily  *If you need a refill on your cardiac medications before your next appointment, please call your pharmacy*   Lab Work: None ordered If you have labs (blood work) drawn today and your tests are completely normal, you will receive your results only by: MyChart Message (if you have MyChart) OR A paper copy in the mail If you have any lab test that is abnormal or we need to change your treatment, we will call you to review the results.   Testing/Procedures: None ordered   Follow-Up: At [facility], you and your health needs are our priority.  As part of our continuing mission to provide you with exceptional heart care, we have created designated Provider Care Teams.  These Care Teams include your primary Cardiologist (physician) and Advanced Practice Providers (APPs -  Physician Assistants and Nurse Practitioners) who all work together to provide you with the care you need, when you need it.  We recommend signing up for the patient portal called "MyChart".  Sign up information is provided on this After Visit Summary.  MyChart is used to connect with patients for Virtual Visits (Telemedicine).  Patients are able to view lab/test results, encounter notes, upcoming appointments, etc.  Non-urgent messages can be sent to your provider as well.   To learn more about what you can do with MyChart, go to [email or web address].    Your next appointment:   6 month(s)  Provider:   You may see Lorine Bears, MD or one of the following Advanced Practice Providers on your designated Care Team:   Nicolasa Ducking, NP Eula Listen, PA-C Cadence Fransico Michael, PA-C Charlsie Quest, NP

## 2023-06-04 NOTE — Progress Notes (Unsigned)
Cardiology Office Note   Date:  06/04/2023   ID:  Christopher [name], Christopher [name] Nov 30, 1955, MRN [id number]  PCP:  Jackelyn Poling, DO  Cardiologist:   Lorine Bears, MD   Chief Complaint  Patient presents with   Follow-up    F/u echo/zio. Meds reviewed verbally with pt.      History of Present Illness: Christopher [name] is a 67 y.o. male who presents for follow-up visit regarding coronary artery disease.  He has known history of GERD and Raynaud's disease.  He is not a smoker.  He does have family history of premature coronary artery disease. He works as a Airline pilot at [employer]. He had previous lower extremity arterial Doppler in 2018 that was normal. He was seen in early 2022 for exertional burning sensation in the chest and epigastric area radiating to the neck.  Cardiac CTA was done which showed a calcium score of 767 with evidence of severe stenosis in the LAD and moderate stenosis in first diagonal, ramus, left circumflex and OM. Cardiac catheterization was done in February 2022 which showed left dominant coronary arteries with significant three-vessel coronary artery disease.  The culprit felt to be heavily calcified severe mid LAD stenosis.  In addition, there was significant stenosis in the distal LAD.  The RCA was small nondominant but occluded with left-to-right collaterals.  The left circumflex had 80% stenosis.  I performed successful angioplasty and drug-eluting stent placement to the mid LAD.  The procedure was very difficult due to calcifications.  There was 15% residual stenosis in the proximal segment of the stent in spite of high pressure balloon.  2 small diagonals were jailed by the stent with sluggish flow. He underwent staged drug-eluting stent placement to the mid left circumflex in March 2022.  He has small vessel disease that responded to Ranexa. Over the last 2 to 3 months, he struggled with increased symptoms of anxiety and insomnia.  I provided him with alprazolam to  be used as needed.  It does help to get him back to sleep but he wakes up sluggish and not feeling well.  He usually wakes up after 2 hours of going to sleep and then he cannot go back. He does have increased episodes of abdominal and chest pain when it is hot but he has been able to do his morning walks with no issues.  He does complain of heaviness in both legs with exertion but his pulses today are normal.   Past Medical History:  Diagnosis Date   Anxiety    Cervical disc disease    Coronary artery disease    GERD (gastroesophageal reflux disease)    triggers by eating spicy food per pt   Raynaud's disease    Vitamin B 12 deficiency    Vitamin D deficiency     Past Surgical History:  Procedure Laterality Date   CARDIAC CATHETERIZATION     COLONOSCOPY  2011   Johnson-normal exam per pt   CORONARY IMAGING/OCT N/A 11/18/2020   Procedure: INTRAVASCULAR IMAGING/OCT;  Surgeon: Iran Ouch, MD;  Location: [facility];  Service: Cardiovascular;  Laterality: N/A;   CORONARY IMAGING/OCT N/A 12/16/2020   Procedure: INTRAVASCULAR IMAGING/OCT;  Surgeon: Iran Ouch, MD;  Location: [facility];  Service: Cardiovascular;  Laterality: N/A;   CORONARY STENT INTERVENTION  11/18/2020   CORONARY STENT INTERVENTION N/A 11/18/2020   Procedure: CORONARY STENT INTERVENTION;  Surgeon: Iran Ouch, MD;  Location: [facility];  Service: Cardiovascular;  Laterality: N/A;   CORONARY STENT INTERVENTION N/A 12/16/2020   Procedure: CORONARY STENT INTERVENTION;  Surgeon: Iran Ouch, MD;  Location: [facility];  Service: Cardiovascular;  Laterality: N/A;   infertility surgery     LEFT HEART CATH AND CORONARY ANGIOGRAPHY N/A 11/18/2020   Procedure: LEFT HEART CATH AND CORONARY ANGIOGRAPHY;  Surgeon: Iran Ouch, MD;  Location: [facility];  Service: Cardiovascular;  Laterality: N/A;   NECK SURGERY  2007   UPPER GASTROINTESTINAL ENDOSCOPY  2020    Ganem-h.pylori per pt     Current Outpatient Medications  Medication Sig Dispense Refill   apixaban (ELIQUIS) 5 MG TABS tablet Take 1 tablet (5 mg total) by mouth 2 (two) times daily. 180 tablet 1   nitroGLYCERIN (NITROSTAT) 0.4 MG SL tablet DISSOLVE ONE TABLET UNDER TONGUE EVERY 5 MINUTES AS NEEDED FOR CHEST PAIN 25 tablet 2   ranolazine (RANEXA) 1000 MG SR tablet Take 1 tablet (1,000 mg total) by mouth 2 (two) times daily. 180 tablet 3   rosuvastatin (CRESTOR) 20 MG tablet Take 1 tablet (20 mg total) by mouth daily. 90 tablet 3   ALPRAZolam (XANAX) 0.25 MG tablet Take 1 tablet (0.25 mg total) by mouth at bedtime as needed for anxiety. (Patient not taking: Reported on 06/04/2023) 30 tablet 5   melatonin 3 MG TABS tablet Take 1 tablet (3 mg total) by mouth at bedtime as needed. (Patient not taking: Reported on 06/04/2023) 20 tablet 0   No current facility-administered medications for this visit.    Allergies:   Azithromycin    Social History:  The patient  reports that he has never smoked. He has never used smokeless tobacco. He reports that he does not currently use alcohol. He reports that he does not currently use drugs.   Family History:  The patient's family history includes CAD in his father; Diabetes in his mother; Heart disease (age of onset: 21) in his mother; Hyperlipidemia in his mother; Hypertension in his mother; Stroke in his father.    ROS:  Please see the history of present illness.   Otherwise, review of systems are positive for none.   All other systems are reviewed and negative.    PHYSICAL EXAM: VS:  BP 124/74 (BP Location: Left Arm, Patient Position: Sitting, Cuff Size: Normal)   Pulse 84   Ht 5\' 8"  (1.727 m)   Wt 132 lb 6 oz (60 kg)   SpO2 98%   BMI 20.13 kg/m  , BMI Body mass index is 20.13 kg/m. GEN: Well nourished, well developed, in no acute distress  HEENT: normal  Neck: no JVD, carotid bruits, or masses Cardiac: RRR; no murmurs, rubs, or gallops,no  edema  Respiratory:  clear to auscultation bilaterally, normal work of breathing GI: soft, nontender, nondistended, + BS MS: no deformity or atrophy  Skin: warm and dry, no rash Neuro:  Strength and sensation are intact Psych: euthymic mood, full affect Vascular: Femoral pulses are normal bilaterally.distal pulses are palpable.   EKG:  EKG is ordered today. The ekg ordered today demonstrates normal sinus rhythm with no significant ST or T wave changes.   Recent Labs: 05/05/2023: TSH 1.654 05/07/2023: ALT 15; B Natriuretic Peptide 179.1; BUN 9; Creatinine, Ser 1.03; Hemoglobin 12.5; Magnesium 2.0; Platelets 128; Potassium 4.7; Sodium 133    Lipid Panel    Component Value Date/Time   CHOL 120 05/06/2023 0112   TRIG 48 05/06/2023 0112   HDL 64 05/06/2023 0112  CHOLHDL 1.9 05/06/2023 0112   VLDL 10 05/06/2023 0112   LDLCALC 46 05/06/2023 0112      Wt Readings from Last 3 Encounters:  06/04/23 132 lb 6 oz (60 kg)  05/05/23 132 lb 11.5 oz (60.2 kg)  04/10/23 132 lb (59.9 kg)           10/25/2020    4:44 PM  PAD Screen  Previous PAD dx? No  Previous surgical procedure? No  Pain with walking? No  Feet/toe relief with dangling? No  Painful, non-healing ulcers? No  Extremities discolored? Yes      ASSESSMENT AND PLAN:  1.  Coronary artery disease involving native coronary arteries with stable angina: His EKG is normal with no ischemic changes.  I do not feel that his symptoms have changed from before.  He is able to walk with no exertional symptoms.  He does have some symptoms when it is extremely hot.   I recommend continuing current medications.  I refilled sublingual nitroglycerin.  2.  Hyperlipidemia: Continue treatment with rosuvastatin 20 mg once daily.  Most recent lipid profile showed an LDL of 72.  He does complain of aching in his thighs especially with walking but the symptoms did not start with initiation of rosuvastatin.  3.  Bilateral leg exertional pain:  His symptoms are suggestive of claudication.  However, his femoral and distal pulses are palpable.  I doubt obstructive disease.  4.  Anxiety and insomnia: I suspect that the majority of his symptoms are related to this.  Will try amitriptyline 25 mg at bedtime.    Disposition:   Follow-up with me in 6 months.  Signed,  Lorine Bears, MD  06/04/2023 3:59 PM    [facility]

## 2023-08-30 ENCOUNTER — Other Ambulatory Visit: Payer: Self-pay | Admitting: Cardiovascular Disease

## 2023-09-19 ENCOUNTER — Ambulatory Visit: Payer: BC Managed Care – PPO | Attending: Cardiovascular Disease | Admitting: Cardiovascular Disease

## 2023-09-19 ENCOUNTER — Encounter: Payer: Self-pay | Admitting: Cardiovascular Disease

## 2023-09-19 VITALS — BP 118/78 | HR 69 | Ht 68.0 in | Wt 141.4 lb

## 2023-09-19 DIAGNOSIS — I480 Paroxysmal atrial fibrillation: Secondary | ICD-10-CM | POA: Diagnosis not present

## 2023-09-19 DIAGNOSIS — E785 Hyperlipidemia, unspecified: Secondary | ICD-10-CM

## 2023-09-19 DIAGNOSIS — I25118 Atherosclerotic heart disease of native coronary artery with other forms of angina pectoris: Secondary | ICD-10-CM | POA: Diagnosis not present

## 2023-09-19 NOTE — Progress Notes (Signed)
Cardiology Office Note   Date:  09/19/2023   ID:  Jamall, Nivison Dec 14, 1955, MRN [id number]  PCP:  Jackelyn Poling, DO  Cardiologist:   Lorine Bears, MD   Chief Complaint  Patient presents with   Follow-up    6 month f/u no complaints today. Meds reviewed verbally with pt.      History of Present Illness: JAHVIER WALSTON is a 67 y.o. male who presents for follow-up visit regarding coronary artery disease and paroxysmal atrial fibrillation.  He has known history of GERD and Raynaud's disease.  He is not a smoker.  He does have family history of premature coronary artery disease. He works as a Airline pilot at [employer]. He had previous lower extremity arterial Doppler in 2018 that was normal. He was seen in early 2022 for exertional burning sensation in the chest and epigastric area radiating to the neck.  Cardiac CTA was done which showed a calcium score of 767 with evidence of severe stenosis in the LAD and moderate stenosis in first diagonal, ramus, left circumflex and OM. Cardiac catheterization was done in February 2022 which showed left dominant coronary arteries with significant three-vessel coronary artery disease.  The culprit felt to be heavily calcified severe mid LAD stenosis.  In addition, there was significant stenosis in the distal LAD.  The RCA was small nondominant but occluded with left-to-right collaterals.  The left circumflex had 80% stenosis.  I performed successful angioplasty and drug-eluting stent placement to the mid LAD.  The procedure was very difficult due to calcifications.  There was 15% residual stenosis in the proximal segment of the stent in spite of high pressure balloon.  2 small diagonals were jailed by the stent with sluggish flow. He underwent staged drug-eluting stent placement to the mid left circumflex in March 2022.  He has small vessel disease that responded to Ranexa.  He went to the emergency room on July 1 with palpitations and  tachycardia.  This was associated with chest pain and shortness of breath.  He was noted to be tachycardic.  EKG showed atrial fibrillation with RVR.  He was given 10 mg of IV diltiazem and converted to sinus rhythm.  Due to chads Vascor of 3, he was started on anticoagulation with Eliquis.  DAPT was discontinued.  He had a 14-day ZIO monitor done which showed no evidence of atrial fibrillation.  Average heart rate was 84 bpm.  Recent echocardiogram showed normal LV systolic function with no significant valvular abnormalities.  He was hospitalized in August with slurred speech and transient confusion.  Brain MRI was unremarkable.  CTA head and neck was also unremarkable.  He was seen by neurology and was felt not to have a CVA.  He was found to be hyponatremic which was felt to be the culprit.  He improved with normal saline hydration.  He reports improvement and insomnia and anxiety.  He had 1 episode of tachycardia last month that lasted about 2 hours.  No other tachycardic episodes.  No shortness of breath.  He tries to walk on a daily basis.  He did have 1 episode of chest pain in the setting of extremely cold weather.  Otherwise, he is able to walk for 45 minutes without issues.  Past Medical History:  Diagnosis Date   Anxiety    Cervical disc disease    Coronary artery disease    GERD (gastroesophageal reflux disease)    triggers by eating spicy food per pt  Raynaud's disease    Vitamin B 12 deficiency    Vitamin D deficiency     Past Surgical History:  Procedure Laterality Date   CARDIAC CATHETERIZATION     COLONOSCOPY  2011   Johnson-normal exam per pt   CORONARY IMAGING/OCT N/A 11/18/2020   Procedure: INTRAVASCULAR IMAGING/OCT;  Surgeon: Iran Ouch, MD;  Location: [facility];  Service: Cardiovascular;  Laterality: N/A;   CORONARY IMAGING/OCT N/A 12/16/2020   Procedure: INTRAVASCULAR IMAGING/OCT;  Surgeon: Iran Ouch, MD;  Location: [facility];   Service: Cardiovascular;  Laterality: N/A;   CORONARY STENT INTERVENTION  11/18/2020   CORONARY STENT INTERVENTION N/A 11/18/2020   Procedure: CORONARY STENT INTERVENTION;  Surgeon: Iran Ouch, MD;  Location: [facility];  Service: Cardiovascular;  Laterality: N/A;   CORONARY STENT INTERVENTION N/A 12/16/2020   Procedure: CORONARY STENT INTERVENTION;  Surgeon: Iran Ouch, MD;  Location: [facility];  Service: Cardiovascular;  Laterality: N/A;   infertility surgery     LEFT HEART CATH AND CORONARY ANGIOGRAPHY N/A 11/18/2020   Procedure: LEFT HEART CATH AND CORONARY ANGIOGRAPHY;  Surgeon: Iran Ouch, MD;  Location: [facility];  Service: Cardiovascular;  Laterality: N/A;   NECK SURGERY  2007   UPPER GASTROINTESTINAL ENDOSCOPY  2020   Ganem-h.pylori per pt     Current Outpatient Medications  Medication Sig Dispense Refill   apixaban (ELIQUIS) 5 MG TABS tablet Take 1 tablet (5 mg total) by mouth 2 (two) times daily. 180 tablet 1   melatonin 3 MG TABS tablet Take 1 tablet (3 mg total) by mouth at bedtime as needed. 20 tablet 0   metoprolol succinate (TOPROL-XL) 25 MG 24 hr tablet Take 1 tablet (25 mg total) by mouth daily. 90 tablet 1   nitroGLYCERIN (NITROSTAT) 0.4 MG SL tablet DISSOLVE ONE TABLET UNDER TONGUE EVERY 5 MINUTES AS NEEDED FOR CHEST PAIN 25 tablet 2   ranolazine (RANEXA) 1000 MG SR tablet Take 1 tablet (1,000 mg total) by mouth 2 (two) times daily. 180 tablet 3   rosuvastatin (CRESTOR) 20 MG tablet TAKE 1 TABLET(20 MG) BY MOUTH DAILY 90 tablet 3   traZODone (DESYREL) 50 MG tablet Take 50 mg by mouth at bedtime as needed.     ZORYVE 0.3 % FOAM Apply topically daily.     ALPRAZolam (XANAX) 0.25 MG tablet Take 1 tablet (0.25 mg total) by mouth at bedtime as needed for anxiety. (Patient not taking: Reported on 09/19/2023) 30 tablet 5   No current facility-administered medications for this visit.    Allergies:   Azithromycin    Social  History:  The patient  reports that he has never smoked. He has never used smokeless tobacco. He reports that he does not currently use alcohol. He reports that he does not currently use drugs.   Family History:  The patient's family history includes CAD in his father; Diabetes in his mother; Heart disease (age of onset: 75) in his mother; Hyperlipidemia in his mother; Hypertension in his mother; Stroke in his father.    ROS:  Please see the history of present illness.   Otherwise, review of systems are positive for none.   All other systems are reviewed and negative.    PHYSICAL EXAM: VS:  BP 118/78 (BP Location: Left Arm, Patient Position: Sitting, Cuff Size: Normal)   Pulse 69   Ht 5\' 8"  (1.727 m)   Wt 141 lb 6 oz (64.1 kg)  SpO2 99%   BMI 21.50 kg/m  , BMI Body mass index is 21.5 kg/m. GEN: Well nourished, well developed, in no acute distress  HEENT: normal  Neck: no JVD, carotid bruits, or masses Cardiac: RRR; no murmurs, rubs, or gallops,no edema  Respiratory:  clear to auscultation bilaterally, normal work of breathing GI: soft, nontender, nondistended, + BS MS: no deformity or atrophy  Skin: warm and dry, no rash Neuro:  Strength and sensation are intact Psych: euthymic mood, full affect   EKG:  EKG is ordered today. The ekg ordered today demonstrates normal sinus rhythm with no significant ST or T wave changes.   Recent Labs: 05/05/2023: TSH 1.654 05/07/2023: ALT 15; B Natriuretic Peptide 179.1; BUN 9; Creatinine, Ser 1.03; Hemoglobin 12.5; Magnesium 2.0; Platelets 128; Potassium 4.7; Sodium 133    Lipid Panel    Component Value Date/Time   CHOL 120 05/06/2023 0112   TRIG 48 05/06/2023 0112   HDL 64 05/06/2023 0112   CHOLHDL 1.9 05/06/2023 0112   VLDL 10 05/06/2023 0112   LDLCALC 46 05/06/2023 0112      Wt Readings from Last 3 Encounters:  09/19/23 141 lb 6 oz (64.1 kg)  06/04/23 132 lb 6 oz (60 kg)  05/05/23 132 lb 11.5 oz (60.2 kg)            10/25/2020    4:44 PM  PAD Screen  Previous PAD dx? No  Previous surgical procedure? No  Pain with walking? No  Feet/toe relief with dangling? No  Painful, non-healing ulcers? No  Extremities discolored? Yes      ASSESSMENT AND PLAN:  1.  Coronary artery disease involving native coronary arteries with stable angina: His EKG is normal with no ischemic changes.  His symptoms are stable overall.  Continue Toprol and Ranexa.  2.  Paroxysmal atrial fibrillation: He reports 1 episode of tachycardia since the last visit.  His A-fib burden is still relatively low .  If A-fib burden becomes higher, will refer to EP to consider ablation.  3.  Hyperlipidemia: Continue treatment with rosuvastatin 20 mg daily.  Recent lipid profile showed an LDL of 72 which is close to target.    Disposition:   Follow-up with me in 6 months.  Signed,  Lorine Bears, MD  09/19/2023 4:43 PM     [facility]

## 2023-09-19 NOTE — Patient Instructions (Signed)

## 2023-10-20 ENCOUNTER — Other Ambulatory Visit: Payer: Self-pay | Admitting: Cardiovascular Disease

## 2023-11-05 ENCOUNTER — Other Ambulatory Visit: Payer: Self-pay | Admitting: Cardiovascular Disease

## 2023-11-05 NOTE — Telephone Encounter (Signed)
Prescription refill request for Eliquis received. Indication:afib Last office visit:12/24 Scr:1.03  8/24 Age: 68 Weight:64.1  kg  Prescription refilled

## 2023-11-20 ENCOUNTER — Other Ambulatory Visit: Payer: Self-pay | Admitting: Cardiovascular Disease

## 2024-03-03 ENCOUNTER — Other Ambulatory Visit: Payer: Self-pay | Admitting: Cardiovascular Disease

## 2024-05-26 ENCOUNTER — Ambulatory Visit: Payer: Self-pay | Attending: Cardiovascular Disease | Admitting: Cardiovascular Disease

## 2024-05-26 ENCOUNTER — Encounter: Payer: Self-pay | Admitting: Cardiovascular Disease

## 2024-05-26 VITALS — BP 110/70 | HR 72 | Ht 68.0 in | Wt 140.4 lb

## 2024-05-26 DIAGNOSIS — I480 Paroxysmal atrial fibrillation: Secondary | ICD-10-CM

## 2024-05-26 DIAGNOSIS — I25118 Atherosclerotic heart disease of native coronary artery with other forms of angina pectoris: Secondary | ICD-10-CM

## 2024-05-26 DIAGNOSIS — E785 Hyperlipidemia, unspecified: Secondary | ICD-10-CM | POA: Diagnosis not present

## 2024-05-26 NOTE — Progress Notes (Unsigned)
 Cardiology Office Note   Date:  05/26/2024   ID:  Christopher, [name] [birth year], MRN [id number]  PCP:  [name], DO  Cardiologist:   [name], MD   Chief Complaint  Patient presents with   Follow-up    OD 6 month f/u no complaints today. Meds reviewed verbally with pt.      History of Present Illness: Christopher [name] is a 68 y.o. male who presents for follow-up visit regarding coronary artery disease and paroxysmal atrial fibrillation.  He has known history of GERD and Raynaud's disease.  He is not a smoker.  He does have family history of premature coronary artery disease. He works as a Airline pilot at [employer]. He had previous lower extremity arterial Doppler in 2018 that was normal. He was seen in early 2022 for exertional burning sensation in the chest and epigastric area radiating to the neck.  Cardiac CTA was done which showed a calcium  score of 767 with evidence of severe stenosis in the LAD and moderate stenosis in first diagonal, ramus, left circumflex and OM. Cardiac catheterization was done in February 2022 which showed left dominant coronary arteries with significant three-vessel coronary artery disease.  The culprit felt to be heavily calcified severe mid LAD stenosis.  In addition, there was significant stenosis in the distal LAD.  The RCA was small nondominant but occluded with left-to-right collaterals.  The left circumflex had 80% stenosis.  I performed successful angioplasty and drug-eluting stent placement to the mid LAD.  The procedure was  difficult due to calcifications.  There was 15% residual stenosis in the proximal segment of the stent in spite of high pressure balloon.  2 small diagonals were jailed by the stent with sluggish flow. He underwent staged drug-eluting stent placement to the mid left circumflex in March 2022.  He has small vessel disease that responded to Ranexa .  He was diagnosed with atrial fibrillation with RVR in July 2024.  He was  started on anticoagulation at that time and DAPT was discontinued.  He had a 14-day ZIO monitor done which showed no evidence of atrial fibrillation.  Average heart rate was 84 bpm.  Echocardiogram in August 2024 showed normal LV systolic function with no significant valvular abnormalities.  He was hospitalized in August, 2024 with slurred speech and transient confusion.  Brain MRI was unremarkable.  CTA head and neck was also unremarkable.  He was seen by neurology and was felt not to have a CVA.  He was found to be hyponatremic which was felt to be the culprit.  He improved with normal saline hydration.  He has been doing reasonably well overall.  His exercise capacity has improved.  He gets mild chest discomfort when he starts walking but he is able to continue his exercise for 40 minutes and the symptoms disappear.  He felt the most improvement since he started taking ranolazine . He also has intermittent palpitations and tachycardia especially if he misses a dose of Toprol .  No issues with anticoagulation.  Past Medical History:  Diagnosis Date   Anxiety    Cervical disc disease    Coronary artery disease    GERD (gastroesophageal reflux disease)    triggers by eating spicy food per pt   Raynaud's disease    Vitamin B 12 deficiency    Vitamin D deficiency     Past Surgical History:  Procedure Laterality Date   CARDIAC CATHETERIZATION     COLONOSCOPY  2011   Johnson-normal  exam per pt   CORONARY IMAGING/OCT N/A 11/18/2020   Procedure: INTRAVASCULAR IMAGING/OCT;  Surgeon: [name], MD;  Location: [facility];  Service: Cardiovascular;  Laterality: N/A;   CORONARY IMAGING/OCT N/A 12/16/2020   Procedure: INTRAVASCULAR IMAGING/OCT;  Surgeon: [name], MD;  Location: [facility];  Service: Cardiovascular;  Laterality: N/A;   CORONARY STENT INTERVENTION  11/18/2020   CORONARY STENT INTERVENTION N/A 11/18/2020   Procedure: CORONARY STENT INTERVENTION;   Surgeon: [name], MD;  Location: [facility];  Service: Cardiovascular;  Laterality: N/A;   CORONARY STENT INTERVENTION N/A 12/16/2020   Procedure: CORONARY STENT INTERVENTION;  Surgeon: [name], MD;  Location: [facility];  Service: Cardiovascular;  Laterality: N/A;   infertility surgery     LEFT HEART CATH AND CORONARY ANGIOGRAPHY N/A 11/18/2020   Procedure: LEFT HEART CATH AND CORONARY ANGIOGRAPHY;  Surgeon: [name], MD;  Location: [facility];  Service: Cardiovascular;  Laterality: N/A;   NECK SURGERY  2007   UPPER GASTROINTESTINAL ENDOSCOPY  2020   Ganem-h.pylori per pt     Current Outpatient Medications  Medication Sig Dispense Refill   ALPRAZolam  (XANAX ) 0.25 MG tablet Take 1 tablet (0.25 mg total) by mouth at bedtime as needed for anxiety. 30 tablet 5   ELIQUIS  5 MG TABS tablet TAKE 1 TABLET(5 MG) BY MOUTH TWICE DAILY 180 tablet 1   melatonin 3 MG TABS tablet Take 1 tablet (3 mg total) by mouth at bedtime as needed. 20 tablet 0   metoprolol  succinate (TOPROL -XL) 25 MG 24 hr tablet TAKE 1 TABLET(25 MG) BY MOUTH DAILY 90 tablet 1   nitroGLYCERIN  (NITROSTAT ) 0.4 MG SL tablet DISSOLVE ONE TABLET UNDER TONGUE EVERY 5 MINUTES AS NEEDED FOR CHEST PAIN 25 tablet 2   ranolazine  (RANEXA ) 1000 MG SR tablet TAKE 1 TABLET(1000 MG) BY MOUTH TWICE DAILY 180 tablet 3   rosuvastatin  (CRESTOR ) 20 MG tablet TAKE 1 TABLET(20 MG) BY MOUTH DAILY 90 tablet 3   traZODone  (DESYREL ) 50 MG tablet Take 50 mg by mouth at bedtime as needed.     ZORYVE 0.3 % FOAM Apply topically daily.     No current facility-administered medications for this visit.    Allergies:   Azithromycin    Social History:  The patient  reports that he has never smoked. He has never used smokeless tobacco. He reports that he does not currently use alcohol. He reports that he does not currently use drugs.   Family History:  The patient's family history includes CAD in his father;  Diabetes in his mother; Heart disease (age of onset: 30) in his mother; Hyperlipidemia in his mother; Hypertension in his mother; Stroke in his father.    ROS:  Please see the history of present illness.   Otherwise, review of systems are positive for none.   All other systems are reviewed and negative.    PHYSICAL EXAM: VS:  BP 110/70 (BP Location: Left Arm, Patient Position: Sitting, Cuff Size: Normal)   Pulse 72   Ht 5' 8 (1.727 m)   Wt 140 lb 6 oz (63.7 kg)   SpO2 98%   BMI 21.34 kg/m  , BMI Body mass index is 21.34 kg/m. GEN: Well nourished, well developed, in no acute distress  HEENT: normal  Neck: no JVD, carotid bruits, or masses Cardiac: RRR; no murmurs, rubs, or gallops,no edema  Respiratory:  clear to auscultation bilaterally, normal work of breathing GI:  soft, nontender, nondistended, + BS MS: no deformity or atrophy  Skin: warm and dry, no rash Neuro:  Strength and sensation are intact Psych: euthymic mood, full affect   EKG:  EKG is ordered today. The ekg ordered today demonstrates: Normal sinus rhythm Normal ECG When compared with ECG of 19-Sep-2023 16:01, No significant change was found    Recent Labs: No results found for requested labs within last 365 days.    Lipid Panel    Component Value Date/Time   CHOL 120 05/06/2023 0112   TRIG 48 05/06/2023 0112   HDL 64 05/06/2023 0112   CHOLHDL 1.9 05/06/2023 0112   VLDL 10 05/06/2023 0112   LDLCALC 46 05/06/2023 0112      Wt Readings from Last 3 Encounters:  05/26/24 140 lb 6 oz (63.7 kg)  09/19/23 141 lb 6 oz (64.1 kg)  06/04/23 132 lb 6 oz (60 kg)           10/25/2020    4:44 PM  PAD Screen  Previous PAD dx? No  Previous surgical procedure? No  Pain with walking? No  Feet/toe relief with dangling? No  Painful, non-healing ulcers? No  Extremities discolored? Yes      ASSESSMENT AND PLAN:  1.  Coronary artery disease involving native coronary arteries with stable angina: His  symptoms are suggestive of class II angina.  Continue Ranexa  and Toprol .   2.  Paroxysmal atrial fibrillation: He reports intermittent episodes of palpitations but no prolonged tachycardia.  Continue Toprol .  Continue anticoagulation with Eliquis .  Will request his labs from his primary care physician.  3.  Hyperlipidemia: Continue treatment with rosuvastatin  20 mg daily.  If LDL is above 55, we will plan on increasing rosuvastatin  to 40 mg.    Disposition:   Follow-up with me in 12 months.  Signed,  [name], MD  05/26/2024 4:41 PM    [facility]

## 2024-05-26 NOTE — Patient Instructions (Signed)
 Medication Instructions:  Your physician recommends that you continue on your current medications as directed. Please refer to the Current Medication list given to you today.    *If you need a refill on your cardiac medications before your next appointment, please call your pharmacy*  Lab Work: No labs ordered today    Testing/Procedures: No test ordered today   Follow-Up: At [facility], you and your health needs are our priority.  As part of our continuing mission to provide you with exceptional heart care, our providers are all part of one team.  This team includes your primary Cardiologist (physician) and Advanced Practice Providers or APPs (Physician Assistants and Nurse Practitioners) who all work together to provide you with the care you need, when you need it.  Your next appointment:   1 year(s)  Provider:   [name], MD

## 2024-09-14 ENCOUNTER — Other Ambulatory Visit: Payer: Self-pay | Admitting: Cardiovascular Disease

## 2024-09-18 ENCOUNTER — Other Ambulatory Visit: Payer: Self-pay | Admitting: Cardiovascular Disease

## 2024-10-12 ENCOUNTER — Other Ambulatory Visit: Payer: Self-pay | Admitting: Cardiovascular Disease

## 2024-10-12 MED ORDER — APIXABAN 5 MG PO TABS: 5.0000 mg | ORAL_TABLET | Freq: Two times a day (BID) | ORAL | 3 refills | Status: AC
